# Patient Record
Sex: Female | Born: 1975 | Race: Black or African American | Hispanic: No | Marital: Single | Smoking: Never smoker
Health system: Southern US, Community
[De-identification: ages and names within clinical notes are randomized; demographics above are authoritative.]

## PROBLEM LIST (undated history)

## (undated) DIAGNOSIS — L309 Dermatitis, unspecified: Secondary | ICD-10-CM

## (undated) DIAGNOSIS — Z87442 Personal history of urinary calculi: Secondary | ICD-10-CM

## (undated) DIAGNOSIS — Z8619 Personal history of other infectious and parasitic diseases: Secondary | ICD-10-CM

## (undated) DIAGNOSIS — I10 Essential (primary) hypertension: Secondary | ICD-10-CM

## (undated) DIAGNOSIS — N920 Excessive and frequent menstruation with regular cycle: Secondary | ICD-10-CM

## (undated) DIAGNOSIS — D259 Leiomyoma of uterus, unspecified: Secondary | ICD-10-CM

## (undated) DIAGNOSIS — D219 Benign neoplasm of connective and other soft tissue, unspecified: Secondary | ICD-10-CM

## (undated) DIAGNOSIS — A64 Unspecified sexually transmitted disease: Secondary | ICD-10-CM

## (undated) DIAGNOSIS — B009 Herpesviral infection, unspecified: Secondary | ICD-10-CM

## (undated) DIAGNOSIS — A630 Anogenital (venereal) warts: Secondary | ICD-10-CM

## (undated) DIAGNOSIS — N870 Mild cervical dysplasia: Secondary | ICD-10-CM

## (undated) DIAGNOSIS — D500 Iron deficiency anemia secondary to blood loss (chronic): Secondary | ICD-10-CM

## (undated) HISTORY — DX: Mild cervical dysplasia: N87.0

## (undated) HISTORY — DX: Essential (primary) hypertension: I10

## (undated) HISTORY — PX: UMBILICAL HERNIA REPAIR: SHX196

## (undated) HISTORY — PX: FOOT SURGERY: SHX648

## (undated) HISTORY — PX: GYNECOLOGIC CRYOSURGERY: SHX857

## (undated) HISTORY — DX: Herpesviral infection, unspecified: B00.9

## (undated) HISTORY — PX: HYSTEROSCOPY WITH RESECTOSCOPE: SHX5395

## (undated) HISTORY — DX: Anogenital (venereal) warts: A63.0

## (undated) HISTORY — PX: HERNIA REPAIR: SHX51

## (undated) HISTORY — DX: Benign neoplasm of connective and other soft tissue, unspecified: D21.9

## (undated) HISTORY — DX: Unspecified sexually transmitted disease: A64

## (undated) HISTORY — PX: MOUTH SURGERY: SHX715

## (undated) HISTORY — PX: CYSTOSCOPY WITH RETROGRADE PYELOGRAM, URETEROSCOPY AND STENT PLACEMENT: SHX5789

## (undated) HISTORY — PX: MANDIBLE SURGERY: SHX707

---

## 1998-05-30 ENCOUNTER — Emergency Department (HOSPITAL_COMMUNITY): Admission: EM | Admit: 1998-05-30 | Discharge: 1998-05-30 | Payer: Self-pay | Admitting: Emergency Medicine

## 1998-05-30 ENCOUNTER — Encounter: Payer: Self-pay | Admitting: Emergency Medicine

## 1999-02-22 ENCOUNTER — Emergency Department (HOSPITAL_COMMUNITY): Admission: EM | Admit: 1999-02-22 | Discharge: 1999-02-22 | Payer: Self-pay | Admitting: Emergency Medicine

## 1999-02-22 ENCOUNTER — Encounter: Payer: Self-pay | Admitting: Emergency Medicine

## 1999-07-04 ENCOUNTER — Encounter: Payer: Self-pay | Admitting: Emergency Medicine

## 1999-07-04 ENCOUNTER — Emergency Department (HOSPITAL_COMMUNITY): Admission: EM | Admit: 1999-07-04 | Discharge: 1999-07-04 | Payer: Self-pay | Admitting: Emergency Medicine

## 1999-08-24 ENCOUNTER — Emergency Department (HOSPITAL_COMMUNITY): Admission: EM | Admit: 1999-08-24 | Discharge: 1999-08-24 | Payer: Self-pay | Admitting: Emergency Medicine

## 1999-08-24 ENCOUNTER — Encounter: Payer: Self-pay | Admitting: Emergency Medicine

## 1999-09-02 ENCOUNTER — Other Ambulatory Visit: Admission: RE | Admit: 1999-09-02 | Discharge: 1999-09-02 | Payer: Self-pay | Admitting: *Deleted

## 1999-10-01 ENCOUNTER — Ambulatory Visit (HOSPITAL_COMMUNITY): Admission: RE | Admit: 1999-10-01 | Discharge: 1999-10-01 | Payer: Self-pay | Admitting: Surgery

## 1999-10-21 ENCOUNTER — Encounter: Payer: Self-pay | Admitting: Surgery

## 1999-10-21 ENCOUNTER — Encounter: Admission: RE | Admit: 1999-10-21 | Discharge: 1999-10-21 | Payer: Self-pay | Admitting: Surgery

## 2000-03-17 ENCOUNTER — Emergency Department (HOSPITAL_COMMUNITY): Admission: EM | Admit: 2000-03-17 | Discharge: 2000-03-17 | Payer: Self-pay | Admitting: Emergency Medicine

## 2000-09-05 ENCOUNTER — Other Ambulatory Visit: Admission: RE | Admit: 2000-09-05 | Discharge: 2000-09-05 | Payer: Self-pay | Admitting: *Deleted

## 2000-11-05 ENCOUNTER — Inpatient Hospital Stay (HOSPITAL_COMMUNITY): Admission: AD | Admit: 2000-11-05 | Discharge: 2000-11-05 | Payer: Self-pay | Admitting: Gynecology

## 2000-11-05 ENCOUNTER — Encounter: Payer: Self-pay | Admitting: Gynecology

## 2001-05-01 ENCOUNTER — Emergency Department (HOSPITAL_COMMUNITY): Admission: EM | Admit: 2001-05-01 | Discharge: 2001-05-01 | Payer: Self-pay | Admitting: *Deleted

## 2001-05-01 ENCOUNTER — Encounter: Payer: Self-pay | Admitting: Emergency Medicine

## 2001-09-04 ENCOUNTER — Other Ambulatory Visit: Admission: RE | Admit: 2001-09-04 | Discharge: 2001-09-04 | Payer: Self-pay | Admitting: *Deleted

## 2002-01-18 HISTORY — PX: OTHER SURGICAL HISTORY: SHX169

## 2002-01-30 ENCOUNTER — Encounter (INDEPENDENT_AMBULATORY_CARE_PROVIDER_SITE_OTHER): Payer: Self-pay | Admitting: *Deleted

## 2002-01-30 ENCOUNTER — Ambulatory Visit (HOSPITAL_COMMUNITY): Admission: RE | Admit: 2002-01-30 | Discharge: 2002-01-31 | Payer: Self-pay | Admitting: Oral Surgery

## 2002-07-12 ENCOUNTER — Encounter: Payer: Self-pay | Admitting: Emergency Medicine

## 2002-07-12 ENCOUNTER — Emergency Department (HOSPITAL_COMMUNITY): Admission: EM | Admit: 2002-07-12 | Discharge: 2002-07-13 | Payer: Self-pay | Admitting: Emergency Medicine

## 2002-09-06 ENCOUNTER — Other Ambulatory Visit: Admission: RE | Admit: 2002-09-06 | Discharge: 2002-09-06 | Payer: Self-pay | Admitting: Obstetrics and Gynecology

## 2003-01-19 DIAGNOSIS — Z87410 Personal history of cervical dysplasia: Secondary | ICD-10-CM

## 2003-01-19 HISTORY — PX: GYNECOLOGIC CRYOSURGERY: SHX857

## 2003-01-19 HISTORY — DX: Personal history of cervical dysplasia: Z87.410

## 2003-01-24 ENCOUNTER — Other Ambulatory Visit: Admission: RE | Admit: 2003-01-24 | Discharge: 2003-01-24 | Payer: Self-pay | Admitting: Obstetrics and Gynecology

## 2003-02-19 DIAGNOSIS — N870 Mild cervical dysplasia: Secondary | ICD-10-CM | POA: Insufficient documentation

## 2003-09-11 ENCOUNTER — Other Ambulatory Visit: Admission: RE | Admit: 2003-09-11 | Discharge: 2003-09-11 | Payer: Self-pay | Admitting: Obstetrics and Gynecology

## 2004-01-19 DIAGNOSIS — Z8619 Personal history of other infectious and parasitic diseases: Secondary | ICD-10-CM

## 2004-01-19 HISTORY — DX: Personal history of other infectious and parasitic diseases: Z86.19

## 2004-03-09 ENCOUNTER — Other Ambulatory Visit: Admission: RE | Admit: 2004-03-09 | Discharge: 2004-03-09 | Payer: Self-pay | Admitting: Obstetrics and Gynecology

## 2004-06-26 ENCOUNTER — Emergency Department (HOSPITAL_COMMUNITY): Admission: EM | Admit: 2004-06-26 | Discharge: 2004-06-26 | Payer: Self-pay | Admitting: Emergency Medicine

## 2004-08-18 DIAGNOSIS — A64 Unspecified sexually transmitted disease: Secondary | ICD-10-CM | POA: Insufficient documentation

## 2004-09-11 ENCOUNTER — Other Ambulatory Visit: Admission: RE | Admit: 2004-09-11 | Discharge: 2004-09-11 | Payer: Self-pay | Admitting: Obstetrics and Gynecology

## 2005-03-01 ENCOUNTER — Other Ambulatory Visit: Admission: RE | Admit: 2005-03-01 | Discharge: 2005-03-01 | Payer: Self-pay | Admitting: Obstetrics and Gynecology

## 2005-03-17 ENCOUNTER — Emergency Department (HOSPITAL_COMMUNITY): Admission: EM | Admit: 2005-03-17 | Discharge: 2005-03-17 | Payer: Self-pay | Admitting: Emergency Medicine

## 2005-03-18 HISTORY — PX: EXTRACORPOREAL SHOCK WAVE LITHOTRIPSY: SHX1557

## 2005-03-19 ENCOUNTER — Emergency Department (HOSPITAL_COMMUNITY): Admission: EM | Admit: 2005-03-19 | Discharge: 2005-03-19 | Payer: Self-pay | Admitting: Emergency Medicine

## 2005-03-21 ENCOUNTER — Inpatient Hospital Stay (HOSPITAL_COMMUNITY): Admission: AD | Admit: 2005-03-21 | Discharge: 2005-03-21 | Payer: Self-pay | Admitting: Family Medicine

## 2005-03-22 ENCOUNTER — Ambulatory Visit (HOSPITAL_COMMUNITY): Admission: AD | Admit: 2005-03-22 | Discharge: 2005-03-22 | Payer: Self-pay | Admitting: Urology

## 2005-03-25 ENCOUNTER — Ambulatory Visit (HOSPITAL_COMMUNITY): Admission: RE | Admit: 2005-03-25 | Discharge: 2005-03-25 | Payer: Self-pay | Admitting: Urology

## 2005-05-19 ENCOUNTER — Encounter: Payer: Self-pay | Admitting: Gynecology

## 2005-09-13 ENCOUNTER — Other Ambulatory Visit: Admission: RE | Admit: 2005-09-13 | Discharge: 2005-09-13 | Payer: Self-pay | Admitting: Obstetrics and Gynecology

## 2006-02-28 ENCOUNTER — Other Ambulatory Visit: Admission: RE | Admit: 2006-02-28 | Discharge: 2006-02-28 | Payer: Self-pay | Admitting: Obstetrics and Gynecology

## 2006-09-20 ENCOUNTER — Other Ambulatory Visit: Admission: RE | Admit: 2006-09-20 | Discharge: 2006-09-20 | Payer: Self-pay | Admitting: Obstetrics and Gynecology

## 2007-02-13 ENCOUNTER — Emergency Department (HOSPITAL_COMMUNITY): Admission: EM | Admit: 2007-02-13 | Discharge: 2007-02-13 | Payer: Self-pay | Admitting: Emergency Medicine

## 2007-02-20 ENCOUNTER — Other Ambulatory Visit: Admission: RE | Admit: 2007-02-20 | Discharge: 2007-02-20 | Payer: Self-pay | Admitting: Obstetrics and Gynecology

## 2007-09-26 ENCOUNTER — Ambulatory Visit: Payer: Self-pay | Admitting: Obstetrics and Gynecology

## 2007-09-26 ENCOUNTER — Encounter: Payer: Self-pay | Admitting: Obstetrics and Gynecology

## 2007-09-26 ENCOUNTER — Other Ambulatory Visit: Admission: RE | Admit: 2007-09-26 | Discharge: 2007-09-26 | Payer: Self-pay | Admitting: Obstetrics and Gynecology

## 2008-01-19 HISTORY — PX: HYSTEROSCOPY: SHX211

## 2008-03-04 ENCOUNTER — Ambulatory Visit: Payer: Self-pay | Admitting: Obstetrics and Gynecology

## 2008-06-03 ENCOUNTER — Ambulatory Visit: Payer: Self-pay | Admitting: Obstetrics and Gynecology

## 2008-08-07 ENCOUNTER — Encounter: Admission: RE | Admit: 2008-08-07 | Discharge: 2008-08-07 | Payer: Self-pay | Admitting: Obstetrics and Gynecology

## 2008-09-26 ENCOUNTER — Other Ambulatory Visit: Admission: RE | Admit: 2008-09-26 | Discharge: 2008-09-26 | Payer: Self-pay | Admitting: Obstetrics and Gynecology

## 2008-09-26 ENCOUNTER — Ambulatory Visit: Payer: Self-pay | Admitting: Obstetrics and Gynecology

## 2008-09-26 ENCOUNTER — Encounter: Payer: Self-pay | Admitting: Obstetrics and Gynecology

## 2008-10-03 ENCOUNTER — Ambulatory Visit: Payer: Self-pay | Admitting: Obstetrics and Gynecology

## 2008-10-29 ENCOUNTER — Ambulatory Visit: Payer: Self-pay | Admitting: Obstetrics and Gynecology

## 2008-11-04 ENCOUNTER — Ambulatory Visit (HOSPITAL_BASED_OUTPATIENT_CLINIC_OR_DEPARTMENT_OTHER): Admission: RE | Admit: 2008-11-04 | Discharge: 2008-11-04 | Payer: Self-pay | Admitting: Obstetrics and Gynecology

## 2008-11-04 ENCOUNTER — Encounter: Payer: Self-pay | Admitting: Obstetrics and Gynecology

## 2008-11-04 ENCOUNTER — Ambulatory Visit: Payer: Self-pay | Admitting: Obstetrics and Gynecology

## 2008-11-12 ENCOUNTER — Ambulatory Visit: Payer: Self-pay | Admitting: Obstetrics and Gynecology

## 2008-11-28 ENCOUNTER — Ambulatory Visit: Payer: Self-pay | Admitting: Obstetrics and Gynecology

## 2009-04-09 ENCOUNTER — Ambulatory Visit: Payer: Self-pay | Admitting: Obstetrics and Gynecology

## 2009-10-02 ENCOUNTER — Ambulatory Visit: Payer: Self-pay | Admitting: Obstetrics and Gynecology

## 2009-10-02 ENCOUNTER — Other Ambulatory Visit: Admission: RE | Admit: 2009-10-02 | Discharge: 2009-10-02 | Payer: Self-pay | Admitting: Obstetrics and Gynecology

## 2010-04-29 ENCOUNTER — Emergency Department (HOSPITAL_COMMUNITY)
Admission: EM | Admit: 2010-04-29 | Discharge: 2010-04-29 | Disposition: A | Discharge status: Home / Self Care | Payer: BC Managed Care – HMO | Attending: Emergency Medicine | Admitting: Emergency Medicine

## 2010-04-29 DIAGNOSIS — R61 Generalized hyperhidrosis: Secondary | ICD-10-CM | POA: Insufficient documentation

## 2010-04-29 DIAGNOSIS — R05 Cough: Secondary | ICD-10-CM | POA: Insufficient documentation

## 2010-04-29 DIAGNOSIS — R599 Enlarged lymph nodes, unspecified: Secondary | ICD-10-CM | POA: Insufficient documentation

## 2010-04-29 DIAGNOSIS — J029 Acute pharyngitis, unspecified: Secondary | ICD-10-CM | POA: Insufficient documentation

## 2010-04-29 DIAGNOSIS — R059 Cough, unspecified: Secondary | ICD-10-CM | POA: Insufficient documentation

## 2010-04-29 LAB — RAPID STREP SCREEN (MED CTR MEBANE ONLY): Streptococcus, Group A Screen (Direct): NEGATIVE

## 2010-06-05 NOTE — Op Note (Signed)
NAMEADALIDA, GARVER                ACCOUNT NO.:  [id number]   MEDICAL RECORD NO.:  [id number]         PATIENT TYPE:  LAMB   LOCATION:                               FACILITY:  [facility]   PHYSICIAN:  Excell Seltzer. Annabell Howells, M.D.    DATE OF BIRTH:  Feb 25, 1975   DATE OF PROCEDURE:  03/22/2005  DATE OF DISCHARGE:  03/21/2005                                 OPERATIVE REPORT   PROCEDURE:  Cystoscopy. right retrograde pyelogram with interpretation and  placement of right double-J stent.   PREOPERATIVE DIAGNOSIS:  Right proximal ureteral stone.   POSTOPERATIVE DIAGNOSIS:  Right proximal ureteral stone.   SURGEON:  Bjorn Pippin, M.D.   ANESTHESIA:  General.   DRAINS:  6-French x 24 cm double-J stent.   COMPLICATIONS:  None.   INDICATIONS:  Ms. Mcdowell is a 35 year old black female who was found to have a  4 mm proximal right ureteral stone on a CT scan in the ER.  She has had  persistent pain since that diagnosis and after discussing treatment options,  she has elected to undergo stent placement today followed by lithotripsy on  Thursday   DESCRIPTION OF PROCEDURE AND FINDINGS:  The patient was given Cipro  preoperatively.  She was taken to the operating room where general  anesthetic was induced.  She was placed in the lithotomy position.  Her  Perineum and genitalia were prepped with Betadine solution, and she was  draped in the usual sterile fashion.   Cystoscopy was performed using a 21-French scope and the 12 and 70 degrees  lenses. Examination revealed a normal urethra.  The bladder wall was smooth  and pale without tumor, stones or inflammation.  Ureteral orifices were  unremarkable.   A right retrograde pyelogram was performed using a 5-French opening catheter  and Hypaque.  Installation of the contrast demonstrated a normal ureter up  to just below the UPJ where a filling defect consistent with the 4 mm stone  was noted proximal to the filling defect.  There was some dilation of the  internal collecting system.  The stone was not readily visualized on the  initial view.   After completion of the retrograde pyelogram, the guidewire was advanced to  the kidney and a 6-French 24 cm double-J stent without string was inserted  without difficulty over the wire under fluoroscopic guidance.  The wire was  removed leaving good coil in the kidney and good coil in the bladder.   The bladder was then drained.  The cystoscope was removed.  A B&O  suppository was placed.  The patient was taken down from lithotomy position.  Her anesthetic was reversed.  She was removed to the recovery room in stable  condition.  There were no complications.      Excell Seltzer. Annabell Howells, M.D.  Electronically Signed     JJW/MEDQ  D:  03/22/2005  T:  03/23/2005  Job:  [id number]

## 2010-06-05 NOTE — Op Note (Signed)
[facility]  Patient:    Alyssa [name], Alyssa [name]                         MRN: [id number] Proc. Date: 10/01/99 Adm. Date:  [id number] Attending:  Abigail Miyamoto A                           Operative Report  PREOPERATIVE DIAGNOSIS:  Umbilical hernia.  POSTOPERATIVE DIAGNOSIS:  Umbilical hernia.  PROCEDURE:  Umbilical hernia repair.  SURGEON:  Dr. Riley Lam A. Magnus Ivan.  ANESTHESIA:  General endotracheal anesthesia and 0.25% Marcaine plain.  ESTIMATED BLOOD LOSS:  Minimal.  DESCRIPTION OF PROCEDURE:  The patient was brought to the operating room and identified as [employer]. She was placed supine on the operating table and anesthesia was induced. Her abdomen was then prepped and draped in the usual sterile fashion. Using a #15 blade, a small transverse incision was made just below the umbilicus. The incision was carried down to the fascia. The hernia sac was then controlled circumferentially with a hemostat. It was then separated from the overlying umbilical skin. The patient was found to have a moderate amount of scar tissue from a previous umbilical hernia repair as a child. The sac and scar were removed revealing a small fascial defect. The fascial defect was then closed with interrupted #0 Ethibond sutures. Next, the wound was anesthetized with 0.25% Marcaine. The umbilicus was tacked in place with 2-0 Vicryl suture. The subcutaneous layer was closed with interrupted 3-0 Vicryl sutures and the skin was closed with a running 4-0 monocryl. Steri-Strips, gauze and Tegaderm were then applied. The patient tolerated the procedure well. All sponge, needle and instrument counts were correct at the end of the procedure. The patient was the extubated in the operating room and taken in stable condition to the recovery room. DD:  10/01/99 TD:  10/02/99 Job: [id number]

## 2010-06-05 NOTE — Op Note (Signed)
Alyssa [name], [name]                          ACCOUNT NO.:  [id number]   MEDICAL RECORD NO.:  [id number]                   PATIENT TYPE:  OIB   LOCATION:  2871                                 FACILITY:  [facility]   PHYSICIAN:  Hewitt Blade, D.D.S.             DATE OF BIRTH:  10-31-1975   DATE OF PROCEDURE:  01/30/2002  DATE OF DISCHARGE:  01/31/2002                                 OPERATIVE REPORT   PREOPERATIVE DIAGNOSES:  A 3.5 cm intrabony cyst - right mandible, 2.0 cm  intrabony cyst - left mandible, impacted third molar teeth, numbers 1, 16,  17, and 32.   POSTOPERATIVE DIAGNOSES:  A 3.5 cm intrabony cyst - right mandible, 2.0 cm  intrabony cyst - left mandible, impacted third molar teeth, numbers 1, 16,  17, and 32.   SURGERY PERFORMED:  Removal of the intrabony cysts of the right and left  mandible, removal of embedded third molar teeth, numbers 1, 16, 17, and 32.   INDICATIONS FOR PROCEDURE:  The patient is a 35 year old black female, who  was referred to my office for evaluation and removal of a large cyst in the  right mandible.  A panoramic radiograph was obtained, which revealed a 3.5  cm intrabony cyst associated with the embedded and displaced tooth number 32  and a 2.0 cm intrabony cyst associated with embedded third molar tooth  number 17.  The patient had developed severe pain and infection in the cyst.  Due to the displacement of the tooth inferiorly and posteriorly into the  mandible around the neurovascular bundle, the large size of the cyst  involving the neurovascular bundles, it was recommended that the procedure  be performed under general anesthesia, where the nerves could be dissected  free.   DETAILS OF PROCEDURE:  On January 30, 2002, the patient was taken to [facility], where she was placed on the operating room  table in a supine position.  Following successful nasal endotracheal  intubation and general anesthesia, the  patient's face, neck, and oral cavity  were prepped and draped in usual sterile operating room fashion.  The  hypopharynx was suctioned free of fluid and secretions, and a moistened 2  inch vaginal pack was placed as a throat pack.  Attention was then directed intraorally, where approximately 12 cc of 0.5%  Xylocaine containing 1:200,000 epinephrine were infiltrated in the right and  left posterior/superior alveolar neurovascular regions, corresponding  palatal soft tissues, and the right and left inferior alveolar neurovascular  regions.  Attention was then directed towards the right maxillary arch,  where a 2.0 curvilinear incision was made over the alveolus distal to the  tooth number two.  A #9 Molt periosteal elevator was used to reflect a full-  thickness mucoperiosteal flap superiorly and laterally exposing the cortical  bone of the right maxilla.  The overlying bone was then reduced using a  Stryker rotary osteotome and a #8 round bur.  The embedded tooth was  visualized in the alveolus and subluxated using a 301 and a 1191 dental  elevator.  The tooth was removed from the oral cavity using rongeur and  cutting forceps.  The surrounding follicular tissue was curetted and removed  using a double-ended Molt curet.  Bony margins were smoothed and contoured  with a small osseous file.  Then, the surgical site was thoroughly irrigated  with sterile saline irrigating solution and suctioned.  The mucoperiosteal  margins were approximated and sutured in an interrupted fashion using 4-0  chromic suture material.  In a similar fashion, third molar tooth and  follicular tissue #16 was removed as well.  Attention was then directed  towards the right mandible, where a 2.5 curvilinear incision was made  through the mucoperiosteal tissues over the ascending ramus and brought  through the soft tissues of the right posterior buccal vestibule.  A #9 Molt  periosteal elevator was used to reflect a  full-thickness mucoperiosteal flap  laterally and inferiorly exposing the superior and lateral aspect of the  bony mandible.  An intraosseous window was then created using the Stryker  rotary osteotome with a #8 round bur.  The large cystic cavity was then  entered and found to be full of yellowish colored fluid.  The embedded tooth  was then visualized at the inferior portion of the cystic cavity and the  tooth was sectioned in its long and vertical axes using the Stryker rotary  osteotome and a 107 fissure bur.  The sections of tooth were then removed  from the mandible using a 301 elevator, 11A elevator, and  190 elevator.  The fragments were then removed from the mandible using mosquito hemostats.  The surrounding dental follicular tissues were then curetted from the  margins of the cystic cavity using a double-ended Molt curet.  The  neurovascular bundle was encountered and the cystic lining was dissected  from the neurovascular with the neurovascular bundle remaining intact.  The  cystic lining was submitted for histologic examination.  The bony margins  were then thoroughly inspected for remnants of the cystic lining.  None were  found.  The bony margins of the intraosseous window were then smoothed with  a small osseous file and the surgical site was copiously irrigated with  sterile saline irrigating solutions and suctioned.  The mucoperiosteal  margins were approximated in a continuous interlocking fashion using 4-0  chromic suture material.  In a similar fashion, embedded tooth #17 and the  associated cyst were removed as well.  The oral cavity was then thoroughly  suctioned free of fluids and secretions.  The throat pack was removed and  the hypopharynx suctioned free of  fluids and secretions.  The patient was  then allowed to awaken from the anesthesia and taken to the recovery room,  where she tolerated the procedure well without apparent complication.                                                Hewitt Blade, D.D.S.    DC/MEDQ  D:  02/02/2002  T:  02/03/2002  Job:  [id number]

## 2010-09-30 DIAGNOSIS — B009 Herpesviral infection, unspecified: Secondary | ICD-10-CM | POA: Insufficient documentation

## 2010-10-08 LAB — URINALYSIS, ROUTINE W REFLEX MICROSCOPIC
Glucose, UA: NEGATIVE
Hgb urine dipstick: NEGATIVE
Nitrite: NEGATIVE

## 2010-10-08 LAB — CBC
HCT: 38.2
MCHC: 33.4
RDW: 12.5
WBC: 5.9

## 2010-10-08 LAB — BASIC METABOLIC PANEL
BUN: 7
CO2: 25
Chloride: 105
GFR calc Af Amer: >60
Glucose, Bld: 87

## 2010-10-08 LAB — DIFFERENTIAL
Basophils Absolute: 0
Eosinophils Relative: 3
Lymphocytes Relative: 40
Lymphs Abs: 2.4
Monocytes Absolute: 0.5
Neutro Abs: 2.8

## 2010-10-08 LAB — PREGNANCY, URINE: Preg Test, Ur: NEGATIVE

## 2010-10-14 ENCOUNTER — Other Ambulatory Visit (HOSPITAL_COMMUNITY)
Admission: RE | Admit: 2010-10-14 | Discharge: 2010-10-14 | Disposition: A | Discharge status: Home / Self Care | Payer: BC Managed Care – HMO | Source: Ambulatory Visit | Attending: Obstetrics and Gynecology | Admitting: Obstetrics and Gynecology

## 2010-10-14 ENCOUNTER — Encounter: Payer: Self-pay | Admitting: Obstetrics and Gynecology

## 2010-10-14 ENCOUNTER — Ambulatory Visit (INDEPENDENT_AMBULATORY_CARE_PROVIDER_SITE_OTHER): Payer: BC Managed Care – HMO | Admitting: Obstetrics and Gynecology

## 2010-10-14 DIAGNOSIS — Z113 Encounter for screening for infections with a predominantly sexual mode of transmission: Secondary | ICD-10-CM

## 2010-10-14 DIAGNOSIS — A549 Gonococcal infection, unspecified: Secondary | ICD-10-CM

## 2010-10-14 DIAGNOSIS — Z309 Encounter for contraceptive management, unspecified: Secondary | ICD-10-CM

## 2010-10-14 DIAGNOSIS — Z01419 Encounter for gynecological examination (general) (routine) without abnormal findings: Secondary | ICD-10-CM | POA: Insufficient documentation

## 2010-10-14 DIAGNOSIS — Z833 Family history of diabetes mellitus: Secondary | ICD-10-CM

## 2010-10-14 DIAGNOSIS — D219 Benign neoplasm of connective and other soft tissue, unspecified: Secondary | ICD-10-CM | POA: Insufficient documentation

## 2010-10-14 DIAGNOSIS — A5400 Gonococcal infection of lower genitourinary tract, unspecified: Secondary | ICD-10-CM

## 2010-10-14 MED ORDER — NORELGESTROMIN-ETH ESTRADIOL 150-35 MCG/24HR TD PTWK: 1.0000 | MEDICATED_PATCH | TRANSDERMAL | Status: DC

## 2010-10-14 NOTE — Progress Notes (Signed)
Patient came to see me today for her annual GYN exam. She is doing well and is still happy with her contraceptive patch. She continues with acyclovir 400 mg daily. She's had no reoccurrences of HSV. She still very uncomfortable with the fact that she has it. She is having regular periods.  Physical examination: HEENT within normal limits. Neck: Thyroid not large. No masses. Supraclavicular nodes: not enlarged. Breasts: Examined in both sitting midline position. No skin changes and no masses. Abdomen: Soft no guarding rebound or masses or hernia. Pelvic: External: Within normal limits. BUS: Within normal limits. Vaginal:within normal limits. Good estrogen effect. No evidence of cystocele rectocele or enterocele. Cervix: clean. Uterus: Normal size and shape. Adnexa: No masses. Rectovaginal exam: Confirmatory and negative. Extremities: Within normal limits.  Assessment: HSV-2  Plan: Continue Ortho Evra patch. Continue acyclovir 400 mg daily. We discussed today counseling because of her concerns with the HSV.

## 2010-10-14 NOTE — Progress Notes (Addendum)
Addended byCammie Mcgee T on: 10/14/2010 03:52 PM  Modules accepted: Orders Patient's gonorrhea culture came back positive. Her chlamydia culture was negative. She was treated with Suprax and Zithromax. She was instructed not to have intercourse till she had negative cultures and her partner was treated and was cured. She will contact him. She knows to return to my office in 3 weeks.

## 2010-10-15 MED ORDER — AZITHROMYCIN 500 MG PO TABS
1000.0000 mg | ORAL_TABLET | Freq: Once | ORAL | Status: AC
Start: 2010-10-15 — End: 2010-10-18

## 2010-10-15 MED ORDER — CEFIXIME 400 MG PO TABS: 400.0000 mg | ORAL_TABLET | Freq: Every day | ORAL | Status: AC

## 2010-10-15 NOTE — Progress Notes (Signed)
Addended by: Trellis Paganini on: 10/15/2010 03:10 PM   Modules accepted: Orders

## 2010-10-19 ENCOUNTER — Telehealth: Payer: Self-pay | Admitting: *Deleted

## 2010-10-19 MED ORDER — FLUCONAZOLE 150 MG PO TABS: 150.0000 mg | ORAL_TABLET | Freq: Once | ORAL | Status: DC

## 2010-10-19 NOTE — Telephone Encounter (Signed)
Patient called c/o yeast infection since she has been prescribed an antibiotic.  Wants to get an RX for Difulcan.  Please advise.

## 2010-10-19 NOTE — Telephone Encounter (Signed)
Please call in Diflucan 150x1 dose with a refill.

## 2010-10-19 NOTE — Telephone Encounter (Signed)
Lm on patients vm.  RX sent.

## 2010-10-22 ENCOUNTER — Telehealth: Payer: Self-pay | Admitting: *Deleted

## 2010-10-22 NOTE — Telephone Encounter (Signed)
Patient upset about GC results.  York Spaniel that partner has been tested and has neg results.  Patient not understanding how come pos results here for her.  I told her I knew there was an incubation time.  She said she wants to speak directly with you.  She said she was told we had a new "system".  I told her that we did not have a new lab machine, we had a new computer system.  She is concerned that her result would be mixed with someone else.  And also wants to see if it could be run again.  Please call patient.  Thanks.

## 2010-10-22 NOTE — Telephone Encounter (Signed)
TC reviewed transmission of GC, also men can have false neg. Will RTO for TOC.  Questions answered.

## 2010-11-06 ENCOUNTER — Ambulatory Visit (INDEPENDENT_AMBULATORY_CARE_PROVIDER_SITE_OTHER): Payer: BC Managed Care – HMO | Admitting: Women's Health

## 2010-11-06 ENCOUNTER — Encounter: Payer: Self-pay | Admitting: Women's Health

## 2010-11-06 DIAGNOSIS — Z113 Encounter for screening for infections with a predominantly sexual mode of transmission: Secondary | ICD-10-CM

## 2010-11-06 DIAGNOSIS — Z708 Other sex counseling: Secondary | ICD-10-CM

## 2010-11-06 NOTE — Progress Notes (Signed)
  Presents for a test to cure GC. Has been treated, has abstained. Denies any abdominal pain or discharge. External genitalia is within normal limits, speculum exam cervix is pink healthy without discharge or erythema. GC/Chlamydia culture taken and is pending. No CMT or adnexal fullness or tenderness. Declines an HIV, hepatitis and RPR. Tearful, upset over test results. Plans to abstain, encouraged condoms if becomes active.

## 2011-04-27 ENCOUNTER — Other Ambulatory Visit: Payer: Self-pay | Admitting: Obstetrics and Gynecology

## 2011-07-14 ENCOUNTER — Ambulatory Visit (INDEPENDENT_AMBULATORY_CARE_PROVIDER_SITE_OTHER): Payer: BC Managed Care – PPO | Admitting: Emergency Medicine

## 2011-07-14 VITALS — BP 135/88 | HR 73 | Temp 98.6°F | Resp 16 | Ht 63.0 in | Wt 173.4 lb

## 2011-07-14 DIAGNOSIS — W57XXXA Bitten or stung by nonvenomous insect and other nonvenomous arthropods, initial encounter: Secondary | ICD-10-CM

## 2011-07-14 DIAGNOSIS — Z111 Encounter for screening for respiratory tuberculosis: Secondary | ICD-10-CM

## 2011-07-14 NOTE — Progress Notes (Deleted)
  Subjective:    Patient ID: Alyssa [name], female    DOB: August 24, 1975, 36 y.o.   MRN: [id number]  HPI    Review of Systems     Objective:   Physical Exam        Assessment & Plan:

## 2011-07-14 NOTE — Progress Notes (Signed)
     Patient Name: Alyssa [name] Date of Birth: [birth year] Medical Record Number: [id number] Gender: female Date of Encounter: 07/14/2011  History of Present Illness:  Alyssa [name] is a 36 y.o. very pleasant female patient who presents with the following:  Bitten by insect on the base of her neck last night.  Now red and swollen.  pruritic  Patient Active Problem List  Diagnosis  . STD (sexually transmitted disease)  . CIN I (cervical intraepithelial neoplasia I)  . HSV infection  . Fibroid   Past Medical History  Diagnosis Date  . 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 HSV infection   . Fibroid     MYOMA  . STD (sexually transmitted disease) 08/2004    POSITIVE GC-HSVll   Past Surgical History  Procedure Date  . Hernia repair     X2  . Kidney stones 2004  . Foot surgery   . Hysteroscopy 2010    HYSTEROSCOPIC MYOMECTOMY  . Colposcopy   . Gynecologic cryosurgery   . Mouth surgery    History  Substance Use Topics  . Smoking status: Never Smoker   . Smokeless tobacco: Not on file  . Alcohol Use: Yes     rare   Family History  Problem Relation Age of Onset  . Diabetes Mother   . Hypertension Mother   . Breast cancer Maternal Aunt   . Diabetes Maternal Grandmother   . Hypertension Maternal Grandmother    No Known Allergies  Medication list has been reviewed and updated.  Prior to Admission medications   Medication Sig Start Date End Date Taking? Authorizing Provider  acyclovir (ZOVIRAX) 400 MG tablet TAKE 1 TABLET 3 TIMES A DAY FOR 7 DAYS, THEN TAKE 1 TABLET EVERY DAY 04/27/11  Yes Trellis Paganini, MD  norelgestromin-ethinyl estradiol (ORTHO EVRA) 150-20 MCG/24HR transdermal patch Place 1 patch onto the skin once a week. 10/14/10  Yes Trellis Paganini, MD    Review of Systems:  As per HPI, otherwise negative.    Physical Examination: Filed Vitals:   07/14/11 1436  BP: 135/88  Pulse: 73  Temp: 98.6 F (37 C)    Resp: 16   Filed Vitals:   07/14/11 1436  Height: 5\' 3"  (1.6 m)  Weight: 173 lb 6.4 oz (78.654 kg)   Body mass index is 30.72 kg/(m^2). Ideal Body Weight: Weight in (lb) to have BMI = 25: 140.8    GEN: WDWN, NAD, Non-toxic, Alert & Oriented x 3 HEENT: Atraumatic, Normocephalic.  Ears and Nose: No external deformity. EXTR: No clubbing/cyanosis/edema NEURO: Normal gait.  PSYCH: Normally interactive. Conversant. Not depressed or anxious appearing.  Calm demeanor.  Skin:  Erythematous raised lesion posterior neck  Assessment and Plan: Insect bite Treat with topical otc hydrocortisone cream  Carmelina Dane, MD

## 2011-07-17 ENCOUNTER — Encounter (INDEPENDENT_AMBULATORY_CARE_PROVIDER_SITE_OTHER): Payer: BC Managed Care – HMO

## 2011-07-17 DIAGNOSIS — Z111 Encounter for screening for respiratory tuberculosis: Secondary | ICD-10-CM

## 2011-08-20 LAB — TB SKIN TEST

## 2011-10-18 ENCOUNTER — Ambulatory Visit (INDEPENDENT_AMBULATORY_CARE_PROVIDER_SITE_OTHER): Payer: Self-pay | Admitting: Obstetrics and Gynecology

## 2011-10-18 ENCOUNTER — Encounter: Payer: Self-pay | Admitting: Obstetrics and Gynecology

## 2011-10-18 VITALS — BP 120/76 | Ht 63.0 in | Wt 179.0 lb

## 2011-10-18 DIAGNOSIS — Z113 Encounter for screening for infections with a predominantly sexual mode of transmission: Secondary | ICD-10-CM

## 2011-10-18 DIAGNOSIS — Z01419 Encounter for gynecological examination (general) (routine) without abnormal findings: Secondary | ICD-10-CM

## 2011-10-18 DIAGNOSIS — N898 Other specified noninflammatory disorders of vagina: Secondary | ICD-10-CM

## 2011-10-18 DIAGNOSIS — Z833 Family history of diabetes mellitus: Secondary | ICD-10-CM

## 2011-10-18 LAB — WET PREP FOR TRICH, YEAST, CLUE: Trich, Wet Prep: NONE SEEN

## 2011-10-18 MED ORDER — ACYCLOVIR 400 MG PO TABS: 400.0000 mg | ORAL_TABLET | Freq: Two times a day (BID) | ORAL | Status: DC

## 2011-10-18 MED ORDER — METRONIDAZOLE 0.75 % VA GEL: 1.0000 | Freq: Every day | VAGINAL | Status: DC

## 2011-10-18 MED ORDER — NORELGESTROMIN-ETH ESTRADIOL 150-35 MCG/24HR TD PTWK: 1.0000 | MEDICATED_PATCH | TRANSDERMAL | Status: DC

## 2011-10-18 NOTE — Progress Notes (Signed)
Patient came to see me today for her annual GYN exam. 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 She has had normal Pap smears since then. Her last Pap smear was 2012. We'll also treat her with acyclovir daily to prevent recurrent HSV 2. She actually does not take it daily but only if she feels a recurrence starting. She is having regular cycles. She is on the contraceptive patch. We treated her last year for GC. She had a followup negative culture. She has not been sexually active since then. She did however want to be rechecked. Several weeks ago she had a watery discharge. She took Monistat for one week and is now gone. She is having no abnormal bleeding. She is having no pelvic pain.  Physical examination:Alyssa [name] present. HEENT within normal limits. Neck: Thyroid not large. No masses. Supraclavicular nodes: not enlarged. Breasts: Examined in both sitting and lying  position. No skin changes and no masses. Abdomen: Soft no guarding rebound or masses or hernia. Pelvic: External: Within normal limits. BUS: Within normal limits. Vaginal:within normal limits. Good estrogen effect. No evidence of cystocele rectocele or enterocele. Cervix: clean. Uterus: Normal size and shape. Adnexa: No masses. Rectovaginal exam: Confirmatory and negative. Extremities: Within normal limits. Wet prep positive for amine, clue cells and bacteria.  Assessment: #1. Bacterial vaginosis #2. HSV-2 #3. CIN-1  Plan: We discussed higher circulating estrogen levels with a patch versus pills. We discussed the theoretical risk of DVT. We discussed the low-dose birth control pill. We discussed a Mirena IUD. Patient understands. She does  want to stay on the patch however. The new Pap smear guidelines were discussed with the patient. Pap not done. MetroGel vaginal cream one applicatorful at bedtime in  vagina for 5 days. Continue Zovirax when necessary.

## 2011-10-19 LAB — CBC WITH DIFFERENTIAL/PLATELET
Basophils Absolute: 0 10*3/uL (ref 0.0–0.1)
Eosinophils Relative: 3 % (ref 0–5)
Hemoglobin: 13 g/dL (ref 12.0–15.0)
MCV: 90.8 fL (ref 78.0–100.0)
Monocytes Absolute: 0.3 10*3/uL (ref 0.1–1.0)
Monocytes Relative: 5 % (ref 3–12)
Neutro Abs: 2.4 10*3/uL (ref 1.7–7.7)
Neutrophils Relative %: 38 % — ABNORMAL LOW (ref 43–77)
WBC: 6.4 10*3/uL (ref 4.0–10.5)

## 2011-10-19 LAB — URINALYSIS W MICROSCOPIC + REFLEX CULTURE
Bilirubin Urine: NEGATIVE
Casts: NONE SEEN
Glucose, UA: NEGATIVE mg/dL
Nitrite: NEGATIVE
Protein, ur: NEGATIVE mg/dL
Specific Gravity, Urine: 1.023 (ref 1.005–1.030)

## 2011-10-20 LAB — URINE CULTURE: Colony Count: 55000

## 2011-10-25 ENCOUNTER — Telehealth: Payer: Self-pay | Admitting: *Deleted

## 2011-10-25 ENCOUNTER — Other Ambulatory Visit: Payer: Self-pay | Admitting: Women's Health

## 2011-10-25 DIAGNOSIS — B373 Candidiasis of vulva and vagina: Secondary | ICD-10-CM

## 2011-10-25 MED ORDER — FLUCONAZOLE 150 MG PO TABS: 150.0000 mg | ORAL_TABLET | Freq: Once | ORAL | Status: DC

## 2011-10-25 NOTE — Telephone Encounter (Signed)
(  Dr.G patient) Pt given Metrogel on 10/18/11 completed medication on Friday, pt now c/o thick white discharge x 1 day now. No smell, nor other complaints. Please advise

## 2011-10-25 NOTE — Telephone Encounter (Signed)
Telephone call, states odor gone, questions if it is now yeast. Reviewed if not itching possibly the medicine. Requested a Diflucan, will call in and and only take if needed.

## 2011-11-11 ENCOUNTER — Telehealth: Payer: Self-pay | Admitting: *Deleted

## 2011-11-11 MED ORDER — NORELGESTROMIN-ETH ESTRADIOL 150-35 MCG/24HR TD PTWK: 1.0000 | MEDICATED_PATCH | TRANSDERMAL | Status: DC

## 2011-11-11 NOTE — Telephone Encounter (Signed)
Pt called requesting 1 patch called in to pharmacy for ortho evra, pt has mail order pharmacy and it wont be mailed until 5 days from now. rx sent to local pharmacy.

## 2012-04-28 ENCOUNTER — Ambulatory Visit: Payer: BC Managed Care – PPO

## 2012-04-28 ENCOUNTER — Ambulatory Visit (INDEPENDENT_AMBULATORY_CARE_PROVIDER_SITE_OTHER): Payer: BC Managed Care – PPO | Admitting: Family Medicine

## 2012-04-28 VITALS — BP 140/90 | HR 77 | Temp 98.0°F | Resp 16 | Ht 62.5 in | Wt 171.8 lb

## 2012-04-28 DIAGNOSIS — M25473 Effusion, unspecified ankle: Secondary | ICD-10-CM

## 2012-04-28 DIAGNOSIS — M79672 Pain in left foot: Secondary | ICD-10-CM

## 2012-04-28 DIAGNOSIS — M722 Plantar fascial fibromatosis: Secondary | ICD-10-CM

## 2012-04-28 DIAGNOSIS — M25472 Effusion, left ankle: Secondary | ICD-10-CM

## 2012-04-28 DIAGNOSIS — M25476 Effusion, unspecified foot: Secondary | ICD-10-CM

## 2012-04-28 DIAGNOSIS — M79609 Pain in unspecified limb: Secondary | ICD-10-CM

## 2012-04-28 LAB — URIC ACID: Uric Acid, Serum: 2.7 mg/dL (ref 2.4–7.0)

## 2012-04-28 MED ORDER — DICLOFENAC SODIUM 75 MG PO TBEC: 75.0000 mg | DELAYED_RELEASE_TABLET | Freq: Two times a day (BID) | ORAL | Status: DC

## 2012-04-28 NOTE — Patient Instructions (Signed)
Plantar Fasciitis (Heel Spur Syndrome) with Rehab The plantar fascia is a fibrous, ligament-like, soft-tissue structure that spans the bottom of the foot. Plantar fasciitis is a condition that causes pain in the foot due to inflammation of the tissue. SYMPTOMS   Pain and tenderness on the underneath side of the foot.  Pain that worsens with standing or walking. CAUSES  Plantar fasciitis is caused by irritation and injury to the plantar fascia on the underneath side of the foot. Common mechanisms of injury include:  Direct trauma to bottom of the foot.  Damage to a small nerve that runs under the foot where the main fascia attaches to the heel bone.  Stress placed on the plantar fascia due to bone spurs. RISK INCREASES WITH:   Activities that place stress on the plantar fascia (running, jumping, pivoting, or cutting).  Poor strength and flexibility.  Improperly fitted shoes.  Tight calf muscles.  Flat feet.  Failure to warm-up properly before activity.  Obesity. PREVENTION  Warm up and stretch properly before activity.  Allow for adequate recovery between workouts.  Maintain physical fitness:  Strength, flexibility, and endurance.  Cardiovascular fitness.  Maintain a health body weight.  Avoid stress on the plantar fascia.  Wear properly fitted shoes, including arch supports for individuals who have flat feet. PROGNOSIS  If treated properly, then the symptoms of plantar fasciitis usually resolve without surgery. However, occasionally surgery is necessary. RELATED COMPLICATIONS   Recurrent symptoms that may result in a chronic condition.  Problems of the lower back that are caused by compensating for the injury, such as limping.  Pain or weakness of the foot during push-off following surgery.  Chronic inflammation, scarring, and partial or complete fascia tear, occurring more often from repeated injections. TREATMENT  Treatment initially involves the use of  ice and medication to help reduce pain and inflammation. The use of strengthening and stretching exercises may help reduce pain with activity, especially stretches of the Achilles tendon. These exercises may be performed at home or with a therapist. Your caregiver may recommend that you use heel cups of arch supports to help reduce stress on the plantar fascia. Occasionally, corticosteroid injections are given to reduce inflammation. If symptoms persist for greater than 6 months despite non-surgical (conservative), then surgery may be recommended.  MEDICATION   If pain medication is necessary, then nonsteroidal anti-inflammatory medications, such as aspirin and ibuprofen, or other minor pain relievers, such as acetaminophen, are often recommended.  Do not take pain medication within 7 days before surgery.  Prescription pain relievers may be given if deemed necessary by your caregiver. Use only as directed and only as much as you need.  Corticosteroid injections may be given by your caregiver. These injections should be reserved for the most serious cases, because they may only be given a certain number of times. HEAT AND COLD  Cold treatment (icing) relieves pain and reduces inflammation. Cold treatment should be applied for 10 to 15 minutes every 2 to 3 hours for inflammation and pain and immediately after any activity that aggravates your symptoms. Use ice packs or massage the area with a piece of ice (ice massage).  Heat treatment may be used prior to performing the stretching and strengthening activities prescribed by your caregiver, physical therapist, or athletic trainer. Use a heat pack or soak the injury in warm water. SEEK IMMEDIATE MEDICAL CARE IF:  Treatment seems to offer no benefit, or the condition worsens.  Any medications produce adverse side effects. EXERCISES RANGE   OF MOTION (ROM) AND STRETCHING EXERCISES - Plantar Fasciitis (Heel Spur Syndrome) These exercises may help you  when beginning to rehabilitate your injury. Your symptoms may resolve with or without further involvement from your physician, physical therapist or athletic trainer. While completing these exercises, remember:   Restoring tissue flexibility helps normal motion to return to the joints. This allows healthier, less painful movement and activity.  An effective stretch should be held for at least 30 seconds.  A stretch should never be painful. You should only feel a gentle lengthening or release in the stretched tissue. RANGE OF MOTION - Toe Extension, Flexion  Sit with your right / left leg crossed over your opposite knee.  Grasp your toes and gently pull them back toward the top of your foot. You should feel a stretch on the bottom of your toes and/or foot.  Hold this stretch for __________ seconds.  Now, gently pull your toes toward the bottom of your foot. You should feel a stretch on the top of your toes and or foot.  Hold this stretch for __________ seconds. Repeat __________ times. Complete this stretch __________ times per day.  RANGE OF MOTION - Ankle Dorsiflexion, Active Assisted  Remove shoes and sit on a chair that is preferably not on a carpeted surface.  Place right / left foot under knee. Extend your opposite leg for support.  Keeping your heel down, slide your right / left foot back toward the chair until you feel a stretch at your ankle or calf. If you do not feel a stretch, slide your bottom forward to the edge of the chair, while still keeping your heel down.  Hold this stretch for __________ seconds. Repeat __________ times. Complete this stretch __________ times per day.  STRETCH  Gastroc, Standing  Place hands on wall.  Extend right / left leg, keeping the front knee somewhat bent.  Slightly point your toes inward on your back foot.  Keeping your right / left heel on the floor and your knee straight, shift your weight toward the wall, not allowing your back to  arch.  You should feel a gentle stretch in the right / left calf. Hold this position for __________ seconds. Repeat __________ times. Complete this stretch __________ times per day. STRETCH  Soleus, Standing  Place hands on wall.  Extend right / left leg, keeping the other knee somewhat bent.  Slightly point your toes inward on your back foot.  Keep your right / left heel on the floor, bend your back knee, and slightly shift your weight over the back leg so that you feel a gentle stretch deep in your back calf.  Hold this position for __________ seconds. Repeat __________ times. Complete this stretch __________ times per day. STRETCH  Gastrocsoleus, Standing  Note: This exercise can place a lot of stress on your foot and ankle. Please complete this exercise only if specifically instructed by your caregiver.   Place the ball of your right / left foot on a step, keeping your other foot firmly on the same step.  Hold on to the wall or a rail for balance.  Slowly lift your other foot, allowing your body weight to press your heel down over the edge of the step.  You should feel a stretch in your right / left calf.  Hold this position for __________ seconds.  Repeat this exercise with a slight bend in your right / left knee. Repeat __________ times. Complete this stretch __________ times per day.    STRENGTHENING EXERCISES - Plantar Fasciitis (Heel Spur Syndrome)  These exercises may help you when beginning to rehabilitate your injury. They may resolve your symptoms with or without further involvement from your physician, physical therapist or athletic trainer. While completing these exercises, remember:   Muscles can gain both the endurance and the strength needed for everyday activities through controlled exercises.  Complete these exercises as instructed by your physician, physical therapist or athletic trainer. Progress the resistance and repetitions only as guided. STRENGTH - Towel  Curls  Sit in a chair positioned on a non-carpeted surface.  Place your foot on a towel, keeping your heel on the floor.  Pull the towel toward your heel by only curling your toes. Keep your heel on the floor.  If instructed by your physician, physical therapist or athletic trainer, add ____________________ at the end of the towel. Repeat __________ times. Complete this exercise __________ times per day. STRENGTH - Ankle Inversion  Secure one end of a rubber exercise band/tubing to a fixed object (table, pole). Loop the other end around your foot just before your toes.  Place your fists between your knees. This will focus your strengthening at your ankle.  Slowly, pull your big toe up and in, making sure the band/tubing is positioned to resist the entire motion.  Hold this position for __________ seconds.  Have your muscles resist the band/tubing as it slowly pulls your foot back to the starting position. Repeat __________ times. Complete this exercises __________ times per day.  Document Released: 01/04/2005 Document Revised: 03/29/2011 Document Reviewed: 04/18/2008 [facility] Patient Information 2013 [place], [place]. Plantar Fasciitis Plantar fasciitis is a common condition that causes foot pain. It is soreness (inflammation) of the band of tough fibrous tissue on the bottom of the foot that runs from the heel bone (calcaneus) to the ball of the foot. The cause of this soreness may be from excessive standing, poor fitting shoes, running on hard surfaces, being overweight, having an abnormal walk, or overuse (this is common in runners) of the painful foot or feet. It is also common in aerobic exercise dancers and ballet dancers. SYMPTOMS  Most people with plantar fasciitis complain of:  Severe pain in the morning on the bottom of their foot especially when taking the first steps out of bed. This pain recedes after a few minutes of walking.  Severe pain is experienced also during walking  following a long period of inactivity.  Pain is worse when walking barefoot or up stairs DIAGNOSIS   Your caregiver will diagnose this condition by examining and feeling your foot.  Special tests such as X-rays of your foot, are usually not needed. PREVENTION   Consult a sports medicine professional before beginning a new exercise program.  Walking programs offer a good workout. With walking there is a lower chance of overuse injuries common to runners. There is less impact and less jarring of the joints.  Begin all new exercise programs slowly. If problems or pain develop, decrease the amount of time or distance until you are at a comfortable level.  Wear good shoes and replace them regularly.  Stretch your foot and the heel cords at the back of the ankle (Achilles tendon) both before and after exercise.  Run or exercise on even surfaces that are not hard. For example, asphalt is better than pavement.  Do not run barefoot on hard surfaces.  If using a treadmill, vary the incline.  Do not continue to workout if you have foot or joint  problems. Seek professional help if they do not improve. HOME CARE INSTRUCTIONS   Avoid activities that cause you pain until you recover.  Use ice or cold packs on the problem or painful areas after working out.  Only take over-the-counter or prescription medicines for pain, discomfort, or fever as directed by your caregiver.  Soft shoe inserts or athletic shoes with air or gel sole cushions may be helpful.  If problems continue or become more severe, consult a sports medicine caregiver or your own health care provider. Cortisone is a potent anti-inflammatory medication that may be injected into the painful area. You can discuss this treatment with your caregiver. MAKE SURE YOU:   Understand these instructions.  Will watch your condition.  Will get help right away if you are not doing well or get worse. Document Released: 09/29/2000 Document  Revised: 03/29/2011 Document Reviewed: 11/29/2007 [facility] Patient Information 2013 [place], [place].

## 2012-04-28 NOTE — Progress Notes (Signed)
[facility]:  Office Visit  Chief Complaint:  Chief Complaint  Patient presents with  . Foot Swelling    left. for 2 days    HPI: Alyssa [name] is a 37 y.o. female who complains of left foot swelling and pain x 2 days. She works in [employer] production pl[employer] and also at [employer] 5 hrs per night, on her feet. She has had pain on bottom of foot. No NKI. She has has prior foot surgery for a cyst. She also has pain radiating from bottom of foot into her achilles. She has sharp 6/10 pain, wakes up with it. Worse after standing and walking.  Denies Diabetes . Or neuropathy.   Past Medical History  Diagnosis Date  . 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 HSV infection   . Fibroid     MYOMA  . STD (sexually transmitted disease) 08/2004    POSITIVE GC-HSVll   Past Surgical History  Procedure Laterality Date  . Hernia repair      X2  . Kidney stones  2004  . Foot surgery    . Hysteroscopy  2010    HYSTEROSCOPIC MYOMECTOMY  . Colposcopy    . Gynecologic cryosurgery    . Mouth surgery     History   Social History  . Marital Status: Single    Spouse Name: N/A    Number of Children: N/A  . Years of Education: N/A   Social History Main Topics  . Smoking status: Never Smoker   . Smokeless tobacco: None  . Alcohol Use: Yes     Comment: rare  . Drug Use: No  . Sexually Active: None   Other Topics Concern  . None   Social History Narrative  . None   Family History  Problem Relation Age of Onset  . Diabetes Mother   . Hypertension Mother   . Breast cancer Maternal Aunt     Age 6's  . Diabetes Maternal Grandmother   . Hypertension Maternal Grandmother    No Known Allergies Prior to Admission medications   Medication Sig Start Date End Date Taking? Authorizing Provider  norelgestromin-ethinyl estradiol (ORTHO EVRA) 150-20 MCG/24HR transdermal patch Place 1 patch onto the skin once a week. 10/18/11  Yes Trellis Paganini, MD  acyclovir  (ZOVIRAX) 400 MG tablet Take 1 tablet (400 mg total) by mouth 2 (two) times daily. 10/18/11   Trellis Paganini, MD  fluconazole (DIFLUCAN) 150 MG tablet Take 1 tablet (150 mg total) by mouth once. 10/25/11   Harrington Challenger, NP  metroNIDAZOLE (METROGEL VAGINAL) 0.75 % vaginal gel Place 1 Applicatorful vaginally daily. 10/18/11   Trellis Paganini, MD  norelgestromin-ethinyl estradiol (ORTHO EVRA) 150-20 MCG/24HR transdermal patch Place 1 patch onto the skin once a week. 11/11/11   Trellis Paganini, MD     ROS: The patient denies fevers, chills, night sweats, unintentional weight loss, chest pain, palpitations, wheezing, dyspnea on exertion, nausea, vomiting, abdominal pain, dysuria, hematuria, melena, numbness, weakness, or tingling.   All other systems have been reviewed and were otherwise negative with the exception of those mentioned in the HPI and as above.    PHYSICAL EXAM: Filed Vitals:   04/28/12 1555  BP: 140/90  Pulse: 77  Temp: 98 F (36.7 C)  Resp: 16   Filed Vitals:   04/28/12 1555  Height: 5' 2.5" (1.588 m)  Weight: 171 lb 12.8 oz (77.928 kg)   Body mass  index is 30.9 kg/(m^2).  General: Alert, no acute distress but tearful at the end of interview HEENT:  Normocephalic, atraumatic, oropharynx patent.  Cardiovascular:  Regular rate and rhythm, no rubs murmurs or gallops.  No Carotid bruits, radial pulse intact. No pedal edema.  Respiratory: Clear to auscultation bilaterally.  No wheezes, rales, or rhonchi.  No cyanosis, no use of accessory musculature GI: No organomegaly, abdomen is soft and non-tender, positive bowel sounds.  No masses. Skin: No rashes. Neurologic: Facial musculature symmetric. Psychiatric: Patient is appropriate throughout our interaction. Lymphatic: No cervical lymphadenopathy Musculoskeletal: Gait intact. Left foot-ankle swelling. Neg anterior drawer. Tender along plantar fascia. + DP. No erythema, no warmth. Full ROM, sensation intact, 5/5  strength   LABS: Results for orders placed in visit on 04/28/12  URIC ACID      Result Value Range   Uric Acid, Serum 2.7  2.4 - 7.0 mg/dL     EKG/XRAY:   Primary read interpreted by [facility]. Conley Rolls at [facility]. No acute fx/dislocation ? Old bone fragment/osteophyte on cuboid.  Ankle is normal   ASSESSMENT/PLAN: Encounter Diagnoses  Name Primary?  . Left foot pain Yes  . Ankle swelling, left   . Plantar fasciitis of left foot    Most likely Plantar Fasciitis Gave exercises Patient's friend just committed suicide, she is very emotional at end of OV. Declined any meds. Has support staff at work. Deneis SI/HI Rx Diclofenac F/u with [place] or podiatrist prn    Britainy Kozub PHUONG, DO 04/30/2012 8:35 AM

## 2012-05-02 ENCOUNTER — Telehealth: Payer: Self-pay | Admitting: Radiology

## 2012-05-02 NOTE — Telephone Encounter (Signed)
Message copied by Caffie Damme on Tue May 02, 2012 11:32 AM ------      Message from: Hamilton Capri P      Created: Mon May 01, 2012  5:01 PM       Uric acid normal.  ------

## 2012-05-02 NOTE — Telephone Encounter (Signed)
Called patient to advise  °

## 2012-10-23 ENCOUNTER — Ambulatory Visit (INDEPENDENT_AMBULATORY_CARE_PROVIDER_SITE_OTHER): Payer: BC Managed Care – PPO | Admitting: Gynecology

## 2012-10-23 ENCOUNTER — Encounter: Payer: Self-pay | Admitting: Gynecology

## 2012-10-23 VITALS — BP 120/78 | Ht 64.0 in | Wt 179.0 lb

## 2012-10-23 DIAGNOSIS — D251 Intramural leiomyoma of uterus: Secondary | ICD-10-CM

## 2012-10-23 DIAGNOSIS — Z13220 Encounter for screening for lipoid disorders: Secondary | ICD-10-CM

## 2012-10-23 DIAGNOSIS — Z01419 Encounter for gynecological examination (general) (routine) without abnormal findings: Secondary | ICD-10-CM

## 2012-10-23 DIAGNOSIS — Z113 Encounter for screening for infections with a predominantly sexual mode of transmission: Secondary | ICD-10-CM

## 2012-10-23 LAB — CBC WITH DIFFERENTIAL/PLATELET
Basophils Absolute: 0 10*3/uL (ref 0.0–0.1)
Basophils Relative: 0 % (ref 0–1)
MCHC: 34.4 g/dL (ref 30.0–36.0)
Monocytes Absolute: 0.5 10*3/uL (ref 0.1–1.0)
Neutro Abs: 1.6 10*3/uL — ABNORMAL LOW (ref 1.7–7.7)
Neutrophils Relative %: 29 % — ABNORMAL LOW (ref 43–77)
Platelets: 341 10*3/uL (ref 150–400)
RDW: 13.3 % (ref 11.5–15.5)

## 2012-10-23 MED ORDER — NORELGESTROMIN-ETH ESTRADIOL 150-35 MCG/24HR TD PTWK: 1.0000 | MEDICATED_PATCH | TRANSDERMAL | Status: DC

## 2012-10-23 NOTE — Progress Notes (Signed)
Alyssa [name] Feb 02, 1975 [id number]        37 y.o.  [name] for annual exam.  Former patient of Dr. Eda Paschal. Several issues noted below.  Past medical history,surgical history, medications, allergies, family history and social history were all reviewed and documented in the EPIC chart.  ROS:  Performed and pertinent positives and negatives are included in the history, assessment and plan .  Exam: Kim assistant Filed Vitals:   10/23/12 1513  BP: 120/78  Height: 5\' 4"  (1.626 m)  Weight: 179 lb (81.194 kg)   General appearance  Normal Skin grossly normal Head/Neck normal with no cervical or supraclavicular adenopathy thyroid normal Lungs  clear Cardiac RR, without RMG Abdominal  soft, nontender, without masses, organomegaly or hernia Breasts  examined lying and sitting without masses, retractions, discharge or axillary adenopathy. Pelvic  Ext/BUS/vagina  normal  Cervix  normal GC/Chlamydia  Uterus  anteverted, normal size, shape and contour, midline and mobile nontender   Adnexa  Without masses or tenderness    Anus and perineum  normal   Rectovaginal  normal sphincter tone without palpated masses or tenderness.    Assessment/Plan:  37 y.o. G12P0010 female for annual exam. Regular menses, Ortho Evra birth control  1. Contraception. Patient is using Ortho Evra doing well wants to continue. Does not smoke and is otherwise healthy. Refilled her x1 year. 2. Pap smear 2012. No Pap smear done today. History of CIN-1 status post cryosurgery 2005. Pap smears normal since then. Recommend repeat Pap smear next year at 3 year interval per current screening guidelines. 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 Has several refills of still at home with last outbreak a year ago. Will call if she needs refills. 4. STD screening. GC chlamydia screen done. Not currently sexually active. Declined serum screening such as HIV RPR and hepatitis. 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 Several small less than a centimeter myomas intramural. Exam is normal today. We'll continue with annual exams and followup. 6. Breast health. SBE monthly reviewed. Screening mammograms between 37 and 42 discussed. Had diagnostic mammography 2010 with ultrasound due to cyst in right breast. This does result in her breast exam is normal. Recommend screening mammogram at age 13. 7. Health maintenance. Baseline CBC lipid profile glucose urinalysis ordered. Followup one year, sooner as needed.  Note: This document was prepared with digital dictation and possible smart phrase technology. Any transcriptional errors that result from this process are unintentional.   [name] MD, 3:41 PM 10/23/2012

## 2012-10-23 NOTE — Patient Instructions (Signed)
Follow up in one year, sooner as needed. 

## 2012-10-24 LAB — URINALYSIS W MICROSCOPIC + REFLEX CULTURE
Glucose, UA: NEGATIVE mg/dL
Hgb urine dipstick: NEGATIVE
Leukocytes, UA: NEGATIVE
Nitrite: NEGATIVE
Protein, ur: NEGATIVE mg/dL
pH: 6 (ref 5.0–8.0)

## 2012-10-24 LAB — LIPID PANEL
Cholesterol: 153 mg/dL (ref 0–200)
VLDL: 22 mg/dL (ref 0–40)

## 2012-10-24 LAB — GC/CHLAMYDIA PROBE AMP
CT Probe RNA: NEGATIVE
GC Probe RNA: NEGATIVE

## 2012-10-24 LAB — GLUCOSE, RANDOM: Glucose, Bld: 85 mg/dL (ref 70–99)

## 2012-10-25 LAB — URINE CULTURE
Colony Count: NO GROWTH
Organism ID, Bacteria: NO GROWTH

## 2013-03-13 ENCOUNTER — Other Ambulatory Visit: Payer: Self-pay | Admitting: Obstetrics and Gynecology

## 2013-03-26 ENCOUNTER — Telehealth: Payer: Self-pay

## 2013-03-26 NOTE — Telephone Encounter (Signed)
Patient called with concern.  She uses Ortho-Evra patch. Is not sexually active. Dermatologist prescribed Prednisone for her and she recently started it. Meantime, she had some cramping on Saturday and some spotting. It was her regular time to remove patch on Sunday and she did. She was not expecting menses until Weds. But still spotting since Saturday.  She read online that it could mean you are pregnant or have an STD.  She also asked if it might mean her fibroids are coming back. She is very concerned about this few days of spotting. Ok to reassure or OV?

## 2013-03-26 NOTE — Telephone Encounter (Signed)
I called patient back to let her know Dr. TF off today and I will call her tomorrow. 

## 2013-03-26 NOTE — Telephone Encounter (Signed)
If she is not sexually active, pregnancy and STD is not an issue.  Sometimes steroids can cause irregular bleeding.  I'd recommend restart patch when due, monitor and if irregular bleeding continues then OV.Marland Kitchen

## 2013-03-27 NOTE — Telephone Encounter (Signed)
Patient informed. 

## 2013-06-22 ENCOUNTER — Other Ambulatory Visit: Payer: Self-pay | Admitting: Obstetrics and Gynecology

## 2013-09-12 ENCOUNTER — Telehealth: Payer: Self-pay | Admitting: *Deleted

## 2013-09-12 NOTE — Telephone Encounter (Signed)
Pt called requesting refill on birth control patch, I called pharmacy and she has refills at her pharmacy. I left this on pt voicemail.

## 2013-10-05 ENCOUNTER — Ambulatory Visit (INDEPENDENT_AMBULATORY_CARE_PROVIDER_SITE_OTHER): Payer: BC Managed Care – PPO

## 2013-10-05 ENCOUNTER — Ambulatory Visit (INDEPENDENT_AMBULATORY_CARE_PROVIDER_SITE_OTHER): Payer: BC Managed Care – PPO | Admitting: Internal Medicine

## 2013-10-05 VITALS — BP 136/90 | HR 83 | Temp 98.0°F | Resp 16 | Ht 63.0 in | Wt 182.2 lb

## 2013-10-05 DIAGNOSIS — M25572 Pain in left ankle and joints of left foot: Secondary | ICD-10-CM

## 2013-10-05 DIAGNOSIS — M25579 Pain in unspecified ankle and joints of unspecified foot: Secondary | ICD-10-CM

## 2013-10-05 MED ORDER — PREDNISONE 20 MG PO TABS: ORAL_TABLET | ORAL | Status: DC

## 2013-10-05 NOTE — Progress Notes (Addendum)
Subjective:    Patient ID: Alyssa [name], female    DOB: 02/11/1975, 38 y.o.   MRN: [id number] This chart was scribed for [place]. Laney Pastor, MD by Steva Colder, ED Scribe. The patient was seen in room 4 at 6:46 PM.   Chief Complaint  Patient presents with  . Ankle Pain    left ankle x 4 days    HPI Alyssa [name] is a 38 y.o. female who presents today complaining of left ankle pain onset 4 days. She states that it hurts to walk on it. She states that her foot is swollen and warm. She states that she does not know how she hurt it. She states that she was wearing wedges this Sunday and she "wiggled" in the shoe. Ok after but woke next am with swelling and pain.. She states that she came here last time this happened in 2014. Bloodwork done they ruled out gout, metabolic issues.. She denies any other associated symptoms-fever, redness, other joint involvement, rashes.   Patient Active Problem List   Diagnosis Date Noted  . Fibroid   . HSV infection   . STD (sexually transmitted disease) 08/18/2004  . CIN I (cervical intraepithelial neoplasia I) 02/19/2003   Past Medical History  Diagnosis Date  . 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 HSV infection   . Fibroid     MYOMA  . STD (sexually transmitted disease) 08/2004    POSITIVE GC-HSVll   Past Surgical History  Procedure Laterality Date  . Hernia repair      X2  . Kidney stones  2004  . Foot surgery    . Hysteroscopy  2010    HYSTEROSCOPIC MYOMECTOMY  . Gynecologic cryosurgery    . Mouth surgery     No Known Allergies Prior to Admission medications   Medication Sig Start Date End Date Taking? Authorizing Provider  norelgestromin-ethinyl estradiol (ORTHO EVRA) 150-35 MCG/24HR transdermal patch Place 1 patch onto the skin once a week.   Yes Historical Provider, MD  acyclovir (ZOVIRAX) 400 MG tablet TAKE 1 TABLET (400 MG TOTAL) BY MOUTH 2 (TWO) TIMES DAILY. 03/13/13   [name], MD    [name] 150-35 MCG/24HR transdermal patch APPLY 1 PATCH ONTO SKIN ONCE A WEEK 06/22/13   [name], MD   No underlying illnesses     Review of Systems  Constitutional: Negative for fever and chills.  HENT: Negative for ear pain.   Eyes: Negative for pain.  Respiratory: Negative for cough and shortness of breath.   Cardiovascular: Negative for chest pain.  Gastrointestinal: Negative for nausea and vomiting.  Genitourinary: Negative for frequency.  Neurological: Negative for syncope and headaches.       Objective:   Physical Exam  Nursing note and vitals reviewed. Constitutional: She is oriented to person, place, and time. She appears well-developed and well-nourished. No distress.  HENT:  Head: Normocephalic and atraumatic.  Eyes: EOM are normal.  Neck: Neck supple.  Cardiovascular: Normal rate.   Pulmonary/Chest: Effort normal. No respiratory distress.  Musculoskeletal:  The left ankle is swollen on the medial aspect moderately without redness or ecchymoses. She is tender to palpation over the entire deltoid ligament without bony tenderness except at the tip of the tibia. She has pain on any range of motion. Achilles  is nontender and not enlarged-fifth metatarsal nontender-dorsum of the foot uninvolved. First MTP not swollen. Has pain with weightbearing. No ankle laxity  Neurological: She  is alert and oriented to person, place, and time.  Skin: Skin is warm and dry.  Psychiatric: She has a normal mood and affect. Her behavior is normal.      [facility] reading (PRIMARY) by  Dr. Lashann Hagg=ankle wnl.     BP 136/90  Pulse 83  Temp(Src) 98 F (36.7 C) (Oral)  Resp 16  Ht 5\' 3"  (1.6 m)  Wt 182 lb 3.2 oz (82.645 kg)  BMI 32.28 kg/m2  SpO2 100%  LMP 09/10/2013  Assessment & Plan:  I personally performed the services described in this documentation, which was scribed in my presence. The recorded information has been reviewed and is accurate.   Pain and swelling left  ankle--- this fits with an inflammatory arthropathy although her lab values suggest she does not have gout or metabolic problem like diabetes. She has no other joint involvement to suggest rheumatoid arthritis. There is no fever to suggest a systemic infection. No rashes to also suggest that. CPPD possible but age argues against.  Plan  Cam Walker Ice and elevate all weekend Prednisone 60 mg with six-day taper Recheck in 72 hours--

## 2013-10-18 DIAGNOSIS — Z8742 Personal history of other diseases of the female genital tract: Secondary | ICD-10-CM

## 2013-10-18 HISTORY — DX: Personal history of other diseases of the female genital tract: Z87.42

## 2013-10-25 ENCOUNTER — Ambulatory Visit (INDEPENDENT_AMBULATORY_CARE_PROVIDER_SITE_OTHER): Payer: BC Managed Care – PPO | Admitting: Gynecology

## 2013-10-25 ENCOUNTER — Other Ambulatory Visit (HOSPITAL_COMMUNITY)
Admission: RE | Admit: 2013-10-25 | Discharge: 2013-10-25 | Disposition: A | Discharge status: Home / Self Care | Payer: BC Managed Care – PPO | Source: Ambulatory Visit | Attending: Gynecology | Admitting: Gynecology

## 2013-10-25 ENCOUNTER — Encounter: Payer: Self-pay | Admitting: Gynecology

## 2013-10-25 VITALS — BP 120/76 | Ht 63.5 in | Wt 180.0 lb

## 2013-10-25 DIAGNOSIS — Z01419 Encounter for gynecological examination (general) (routine) without abnormal findings: Secondary | ICD-10-CM

## 2013-10-25 DIAGNOSIS — R87810 Cervical high risk human papillomavirus (HPV) DNA test positive: Secondary | ICD-10-CM | POA: Insufficient documentation

## 2013-10-25 DIAGNOSIS — A609 Anogenital herpesviral infection, unspecified: Secondary | ICD-10-CM

## 2013-10-25 DIAGNOSIS — D251 Intramural leiomyoma of uterus: Secondary | ICD-10-CM

## 2013-10-25 DIAGNOSIS — Z1151 Encounter for screening for human papillomavirus (HPV): Secondary | ICD-10-CM | POA: Insufficient documentation

## 2013-10-25 DIAGNOSIS — Z124 Encounter for screening for malignant neoplasm of cervix: Secondary | ICD-10-CM

## 2013-10-25 LAB — CBC WITH DIFFERENTIAL/PLATELET
BASOS PCT: 0 % (ref 0–1)
Basophils Absolute: 0 10*3/uL (ref 0.0–0.1)
EOS ABS: 0.2 10*3/uL (ref 0.0–0.7)
Eosinophils Relative: 4 % (ref 0–5)
HEMATOCRIT: 37.8 % (ref 36.0–46.0)
Hemoglobin: 13.2 g/dL (ref 12.0–15.0)
Lymphocytes Relative: 43 % (ref 12–46)
Lymphs Abs: 2.2 10*3/uL (ref 0.7–4.0)
MCH: 29.7 pg (ref 26.0–34.0)
MCHC: 34.9 g/dL (ref 30.0–36.0)
MCV: 85.1 fL (ref 78.0–100.0)
MONO ABS: 0.5 10*3/uL (ref 0.1–1.0)
Monocytes Relative: 9 % (ref 3–12)
Neutro Abs: 2.2 10*3/uL (ref 1.7–7.7)
Neutrophils Relative %: 44 % (ref 43–77)
Platelets: 309 10*3/uL (ref 150–400)
RBC: 4.44 MIL/uL (ref 3.87–5.11)
RDW: 13.8 % (ref 11.5–15.5)
WBC: 5 10*3/uL (ref 4.0–10.5)

## 2013-10-25 LAB — URINALYSIS W MICROSCOPIC + REFLEX CULTURE
Bilirubin Urine: NEGATIVE
Casts: NONE SEEN
Crystals: NONE SEEN
Glucose, UA: NEGATIVE mg/dL
HGB URINE DIPSTICK: NEGATIVE
KETONES UR: NEGATIVE mg/dL
NITRITE: NEGATIVE
PROTEIN: NEGATIVE mg/dL
Specific Gravity, Urine: 1.021 (ref 1.005–1.030)
Urobilinogen, UA: 0.2 mg/dL (ref 0.0–1.0)
pH: 6 (ref 5.0–8.0)

## 2013-10-25 LAB — COMPREHENSIVE METABOLIC PANEL
ALK PHOS: 28 U/L — AB (ref 39–117)
ALT: 13 U/L (ref 0–35)
AST: 14 U/L (ref 0–37)
Albumin: 3.7 g/dL (ref 3.5–5.2)
BILIRUBIN TOTAL: 0.4 mg/dL (ref 0.2–1.2)
BUN: 7 mg/dL (ref 6–23)
CO2: 27 mEq/L (ref 19–32)
Calcium: 9.2 mg/dL (ref 8.4–10.5)
Chloride: 103 mEq/L (ref 96–112)
Creat: 0.73 mg/dL (ref 0.50–1.10)
Glucose, Bld: 67 mg/dL — ABNORMAL LOW (ref 70–99)
POTASSIUM: 4.3 meq/L (ref 3.5–5.3)
Sodium: 139 mEq/L (ref 135–145)
Total Protein: 6.5 g/dL (ref 6.0–8.3)

## 2013-10-25 LAB — LIPID PANEL
CHOL/HDL RATIO: 3.1 ratio
Cholesterol: 157 mg/dL (ref 0–200)
HDL: 51 mg/dL (ref >39)
LDL CALC: 85 mg/dL (ref 0–99)
Triglycerides: 107 mg/dL (ref <150)
VLDL: 21 mg/dL (ref 0–40)

## 2013-10-25 MED ORDER — NORELGESTROMIN-ETH ESTRADIOL 150-35 MCG/24HR TD PTWK: MEDICATED_PATCH | TRANSDERMAL | Status: DC

## 2013-10-25 NOTE — Patient Instructions (Signed)
You may obtain a copy of any labs that were done today by logging onto MyChart as outlined in the instructions provided with your AVS (after visit summary). The office will not call with normal lab results but certainly if there are any significant abnormalities then we will contact you.   Health Maintenance, Female A healthy lifestyle and preventative care can promote health and wellness.  Maintain regular health, dental, and eye exams.  Eat a healthy diet. Foods like vegetables, fruits, whole grains, low-fat dairy products, and lean protein foods contain the nutrients you need without too many calories. Decrease your intake of foods high in solid fats, added sugars, and salt. Get information about a proper diet from your caregiver, if necessary.  Regular physical exercise is one of the most important things you can do for your health. Most adults should get at least 150 minutes of moderate-intensity exercise (any activity that increases your heart rate and causes you to sweat) each week. In addition, most adults need muscle-strengthening exercises on 2 or more days a week.   Maintain a healthy weight. The body mass index (BMI) is a screening tool to identify possible weight problems. It provides an estimate of body fat based on height and weight. Your caregiver can help determine your BMI, and can help you achieve or maintain a healthy weight. For adults 20 years and older:  A BMI below 18.5 is considered underweight.  A BMI of 18.5 to 24.9 is normal.  A BMI of 25 to 29.9 is considered overweight.  A BMI of 30 and above is considered obese.  Maintain normal blood lipids and cholesterol by exercising and minimizing your intake of saturated fat. Eat a balanced diet with plenty of fruits and vegetables. Blood tests for lipids and cholesterol should begin at age 61 and be repeated every 5 years. If your lipid or cholesterol levels are high, you are over 50, or you are a high risk for heart  disease, you may need your cholesterol levels checked more frequently.Ongoing high lipid and cholesterol levels should be treated with medicines if diet and exercise are not effective.  If you smoke, find out from your caregiver how to quit. If you do not use tobacco, do not start.  Lung cancer screening is recommended for adults aged 33 80 years who are at high risk for developing lung cancer because of a history of smoking. Yearly low-dose computed tomography (CT) is recommended for people who have at least a 30-pack-year history of smoking and are a current smoker or have quit within the past 15 years. A pack year of smoking is smoking an average of 1 pack of cigarettes a day for 1 year (for example: 1 pack a day for 30 years or 2 packs a day for 15 years). Yearly screening should continue until the smoker has stopped smoking for at least 15 years. Yearly screening should also be stopped for people who develop a health problem that would prevent them from having lung cancer treatment.  If you are pregnant, do not drink alcohol. If you are breastfeeding, be very cautious about drinking alcohol. If you are not pregnant and choose to drink alcohol, do not exceed 1 drink per day. One drink is considered to be 12 ounces (355 mL) of beer, 5 ounces (148 mL) of wine, or 1.5 ounces (44 mL) of liquor.  Avoid use of street drugs. Do not share needles with anyone. Ask for help if you need support or instructions about stopping  the use of drugs.  High blood pressure causes heart disease and increases the risk of stroke. Blood pressure should be checked at least every 1 to 2 years. Ongoing high blood pressure should be treated with medicines, if weight loss and exercise are not effective.  If you are 59 to 36 years old, ask your caregiver if you should take aspirin to prevent strokes.  Diabetes screening involves taking a blood sample to check your fasting blood sugar level. This should be done once every 3  years, after age 91, if you are within normal weight and without risk factors for diabetes. Testing should be considered at a younger age or be carried out more frequently if you are overweight and have at least 1 risk factor for diabetes.  Breast cancer screening is essential preventative care for women. You should practice "breast self-awareness." This means understanding the normal appearance and feel of your breasts and may include breast self-examination. Any changes detected, no matter how small, should be reported to a caregiver. Women in their 66s and 30s should have a clinical breast exam (CBE) by a caregiver as part of a regular health exam every 1 to 3 years. After age 101, women should have a CBE every year. Starting at age 100, women should consider having a mammogram (breast X-ray) every year. Women who have a family history of breast cancer should talk to their caregiver about genetic screening. Women at a high risk of breast cancer should talk to their caregiver about having an MRI and a mammogram every year.  Breast cancer gene (BRCA)-related cancer risk assessment is recommended for women who have family members with BRCA-related cancers. BRCA-related cancers include breast, ovarian, tubal, and peritoneal cancers. Having family members with these cancers may be associated with an increased risk for harmful changes (mutations) in the breast cancer genes BRCA1 and BRCA2. Results of the assessment will determine the need for genetic counseling and BRCA1 and BRCA2 testing.  The Pap test is a screening test for cervical cancer. Women should have a Pap test starting at age 57. Between ages 25 and 35, Pap tests should be repeated every 2 years. Beginning at age 37, you should have a Pap test every 3 years as long as the past 3 Pap tests have been normal. If you had a hysterectomy for a problem that was not cancer or a condition that could lead to cancer, then you no longer need Pap tests. If you are  between ages 50 and 76, and you have had normal Pap tests going back 10 years, you no longer need Pap tests. If you have had past treatment for cervical cancer or a condition that could lead to cancer, you need Pap tests and screening for cancer for at least 20 years after your treatment. If Pap tests have been discontinued, risk factors (such as a new sexual partner) need to be reassessed to determine if screening should be resumed. Some women have medical problems that increase the chance of getting cervical cancer. In these cases, your caregiver may recommend more frequent screening and Pap tests.  The human papillomavirus (HPV) test is an additional test that may be used for cervical cancer screening. The HPV test looks for the virus that can cause the cell changes on the cervix. The cells collected during the Pap test can be tested for HPV. The HPV test could be used to screen women aged 44 years and older, and should be used in women of any age  who have unclear Pap test results. After the age of 55, women should have HPV testing at the same frequency as a Pap test.  Colorectal cancer can be detected and often prevented. Most routine colorectal cancer screening begins at the age of 44 and continues through age 20. However, your caregiver may recommend screening at an earlier age if you have risk factors for colon cancer. On a yearly basis, your caregiver may provide home test kits to check for hidden blood in the stool. Use of a small camera at the end of a tube, to directly examine the colon (sigmoidoscopy or colonoscopy), can detect the earliest forms of colorectal cancer. Talk to your caregiver about this at age 86, when routine screening begins. Direct examination of the colon should be repeated every 5 to 10 years through age 13, unless early forms of pre-cancerous polyps or small growths are found.  Hepatitis C blood testing is recommended for all people born from [birth year] through 1965 and any  individual with known risks for hepatitis C.  Practice safe sex. Use condoms and avoid high-risk sexual practices to reduce the spread of sexually transmitted infections (STIs). Sexually active women aged 36 and younger should be checked for Chlamydia, which is a common sexually transmitted infection. Older women with new or multiple partners should also be tested for Chlamydia. Testing for other STIs is recommended if you are sexually active and at increased risk.  Osteoporosis is a disease in which the bones lose minerals and strength with aging. This can result in serious bone fractures. The risk of osteoporosis can be identified using a bone density scan. Women ages 20 and over and women at risk for fractures or osteoporosis should discuss screening with their caregivers. Ask your caregiver whether you should be taking a calcium supplement or vitamin D to reduce the rate of osteoporosis.  Menopause can be associated with physical symptoms and risks. Hormone replacement therapy is available to decrease symptoms and risks. You should talk to your caregiver about whether hormone replacement therapy is right for you.  Use sunscreen. Apply sunscreen liberally and repeatedly throughout the day. You should seek shade when your shadow is shorter than you. Protect yourself by wearing long sleeves, pants, a wide-brimmed hat, and sunglasses year round, whenever you are outdoors.  Notify your caregiver of new moles or changes in moles, especially if there is a change in shape or color. Also notify your caregiver if a mole is larger than the size of a pencil eraser.  Stay current with your immunizations. Document Released: 07/20/2010 Document Revised: 05/01/2012 Document Reviewed: 07/20/2010 [facility] Patient Information 2014 [place].

## 2013-10-25 NOTE — Progress Notes (Addendum)
Alyssa [name] 1975/02/20 [id number]        38 y.o.  [name] for annual exam.  Several issues noted below.  Past medical history,surgical history, problem list, medications, allergies, family history and social history were all reviewed and documented as reviewed in the EPIC chart.  ROS:  12 system ROS performed with pertinent positives and negatives included in the history, assessment and plan.   Additional significant findings :  none   Exam: Kim Counsellor Vitals:   10/25/13 0858  BP: 120/76  Height: 5' 3.5" (1.613 m)  Weight: 180 lb (81.647 kg)   General appearance:  Normal affect, orientation and appearance. Skin: Grossly normal HEENT: Without gross lesions.  No cervical or supraclavicular adenopathy. Thyroid normal.  Lungs:  Clear without wheezing, rales or rhonchi Cardiac: RR, without RMG Abdominal:  Soft, nontender, without masses, guarding, rebound, organomegaly or hernia Breasts:  Examined lying and sitting without masses, retractions, discharge or axillary adenopathy. Pelvic:  Ext/BUS/vagina normal  Cervix normal. Pap/HPV  Uterus anteverted, normal size, shape and contour, midline and mobile nontender   Adnexa  Without masses or tenderness    Anus and perineum  Normal   Rectovaginal  Normal sphincter tone without palpated masses or tenderness.    Assessment/Plan:  37 y.o. G26P0010 female for annual exam with regular menses, Ortho Evra contraception.   1. Contraception. Patient continues on Ortho Evra doing well. Does not smoke and not being followed for any medical issues. Wants to continue. Reviewed risks of blood clot such as stroke heart attack DVT. Patient's comfortable with continuing I refilled her x1 year. 2. Pap smear 2012. Pap/HPV done today. History of CIN-1 status post cryosurgery 2005. Normal Pap smears since then. Plan repeat Pap smear at 3-5 year interval per current screening guidelines assuming this Pap smears normal. 3. History of genital HSV. Has  not had an outbreak for years. Will call if she needs Zovirax but does not want a prescription now. 4. STD screening. Had done in the past. Patient not sexually active and declines STD screening. 5. Leiomyoma. History of hysteroscopic submucous myomectomy. Several small intramural myomas noted on prior ultrasound. Exam is normal today. Will continue with annual exams to follow. 6. Mammography. Screening mammographic recommendations between 59 and 40 reviewed. Has a prior history of mammography 2010. Prefers to wait closer to 40.  SBE monthly reviewed. 7. Health maintenance. Baseline CBC comprehensive metabolic panel lipid profile urinalysis ordered. Follow up in one year, sooner as needed.     [name] MD, 9:34 AM 10/25/2013

## 2013-10-25 NOTE — Addendum Note (Signed)
Addended by: Nelva Nay on: 10/25/2013 09:46 AM   Modules accepted: Orders

## 2013-10-26 LAB — URINE CULTURE: Colony Count: >=100000

## 2013-10-26 LAB — CYTOLOGY - PAP

## 2013-10-29 ENCOUNTER — Other Ambulatory Visit: Payer: Self-pay | Admitting: Gynecology

## 2013-10-29 MED ORDER — FLUCONAZOLE 150 MG PO TABS: 150.0000 mg | ORAL_TABLET | Freq: Once | ORAL | Status: DC

## 2013-10-29 MED ORDER — NITROFURANTOIN MONOHYD MACRO 100 MG PO CAPS: 100.0000 mg | ORAL_CAPSULE | Freq: Two times a day (BID) | ORAL | Status: DC

## 2013-11-01 ENCOUNTER — Encounter: Payer: Self-pay | Admitting: Gynecology

## 2013-11-19 ENCOUNTER — Encounter: Payer: Self-pay | Admitting: Gynecology

## 2013-12-06 ENCOUNTER — Telehealth: Payer: Self-pay | Admitting: *Deleted

## 2013-12-06 NOTE — Telephone Encounter (Signed)
Pt takes Ortho Evra patch took patch off on Sunday, cycle normal starts on Wednesday, no cycle yet. Pt is not sexually active, asked if okay to place a new patch on Sunday even if no cycle? Pt said she did receive a steroid shot by her dermatologist. Please advise

## 2013-12-07 NOTE — Telephone Encounter (Signed)
Pt informed with the below note. 

## 2013-12-07 NOTE — Telephone Encounter (Signed)
As long as no chance of pregnancy I would go ahead and start the new patch on time

## 2014-02-19 ENCOUNTER — Ambulatory Visit (INDEPENDENT_AMBULATORY_CARE_PROVIDER_SITE_OTHER): Payer: BLUE CROSS/BLUE SHIELD | Admitting: Women's Health

## 2014-02-19 ENCOUNTER — Encounter: Payer: Self-pay | Admitting: Women's Health

## 2014-02-19 ENCOUNTER — Telehealth: Payer: Self-pay | Admitting: *Deleted

## 2014-02-19 VITALS — BP 122/80 | Ht 63.0 in | Wt 175.0 lb

## 2014-02-19 DIAGNOSIS — B9689 Other specified bacterial agents as the cause of diseases classified elsewhere: Secondary | ICD-10-CM

## 2014-02-19 DIAGNOSIS — N760 Acute vaginitis: Secondary | ICD-10-CM

## 2014-02-19 DIAGNOSIS — N898 Other specified noninflammatory disorders of vagina: Secondary | ICD-10-CM

## 2014-02-19 DIAGNOSIS — A499 Bacterial infection, unspecified: Secondary | ICD-10-CM

## 2014-02-19 DIAGNOSIS — R1031 Right lower quadrant pain: Secondary | ICD-10-CM

## 2014-02-19 DIAGNOSIS — R350 Frequency of micturition: Secondary | ICD-10-CM

## 2014-02-19 LAB — WET PREP FOR TRICH, YEAST, CLUE
Trich, Wet Prep: NONE SEEN
YEAST WET PREP: NONE SEEN

## 2014-02-19 LAB — URINALYSIS W MICROSCOPIC + REFLEX CULTURE
Bilirubin Urine: NEGATIVE
Casts: NONE SEEN
Crystals: NONE SEEN
GLUCOSE, UA: NEGATIVE mg/dL
KETONES UR: NEGATIVE mg/dL
Nitrite: NEGATIVE
PROTEIN: 30 mg/dL — AB
Specific Gravity, Urine: 1.02 (ref 1.005–1.030)
UROBILINOGEN UA: 0.2 mg/dL (ref 0.0–1.0)
pH: 6 (ref 5.0–8.0)

## 2014-02-19 MED ORDER — METRONIDAZOLE 0.75 % VA GEL: VAGINAL | Status: DC

## 2014-02-19 MED ORDER — SULFAMETHOXAZOLE-TRIMETHOPRIM 800-160 MG PO TABS: 1.0000 | ORAL_TABLET | Freq: Two times a day (BID) | ORAL | Status: DC

## 2014-02-19 NOTE — Telephone Encounter (Signed)
Pt was seen today given Septra twice daily for 3 days #6. MetroGel vaginal cream 1 applicator at bedtime 5. Pt asked if Rx for yeast could be sent to have on hand incase this should occur after taking antibiotics. Please advise

## 2014-02-19 NOTE — Progress Notes (Signed)
Patient ID: Alyssa [name], female   DOB: Apr 09, 1975, 39 y.o.   MRN: [id number] Presents with complaint of urinary pressure, frequency, urgency for 5 days. Poor sleep last night due to urinary pressure and frequency. Minimal abdominal pain today, intense lower right quadrant pain 2 days ago with some relief with a heating pad. Denies back pain or fever. History of kidney stones, fibroids with myomectomy. Monthly cycle not sexually active for years.  Exam: Appears well. Abdomen soft, obese, no rebound or radiation of pain. External genitalia within normal limits, speculum exam moderate amount of milky adherent discharge with odor noted, wet prep positive for amines, clues, TNTC bacteria. Bimanual no CMT or adnexal tenderness or fullness with exam. UA: Large blood, small leukocytes, 7-10 WBCs, TNTC RBCs, many bacteria and many squamous epithelials.  UTI with hematuria Bacteria vaginosis  Plan: Urine culture pending. Septra twice daily for 3 days #6. MetroGel vaginal cream 1 applicator at bedtime 5, alcohol precautions reviewed, UTI prevention discussed. Instructed to call if no relief of symptoms. Schedule ultrasound after next cycle to assess fibroids.

## 2014-02-19 NOTE — Patient Instructions (Signed)
Bacterial Vaginosis Bacterial vaginosis is an infection of the vagina. It happens when too many of certain germs (bacteria) grow in the vagina. HOME CARE  Take your medicine as told by your doctor.  Finish your medicine even if you start to feel better.  Do not have sex until you finish your medicine and are better.  Tell your sex partner that you have an infection. They should see their doctor for treatment.  Practice safe sex. Use condoms. Have only one sex partner. GET HELP IF:  You are not getting better after 3 days of treatment.  You have more grey fluid (discharge) coming from your vagina than before.  You have more pain than before.  You have a fever. MAKE SURE YOU:   Understand these instructions.  Will watch your condition.  Will get help right away if you are not doing well or get worse. Document Released: 10/14/2007 Document Revised: 10/25/2012 Document Reviewed: 08/16/2012 [facility] Patient Information 2015 [place], [place]. This information is not intended to replace advice given to you by your health care provider. Make sure you discuss any questions you have with your health care provider. Urinary Tract Infection Urinary tract infections (UTIs) can develop anywhere along your urinary tract. Your urinary tract is your body's drainage system for removing wastes and extra water. Your urinary tract includes two kidneys, two ureters, a bladder, and a urethra. Your kidneys are a pair of bean-shaped organs. Each kidney is about the size of your fist. They are located below your ribs, one on each side of your spine. CAUSES Infections are caused by microbes, which are microscopic organisms, including fungi, viruses, and bacteria. These organisms are so small that they can only be seen through a microscope. Bacteria are the microbes that most commonly cause UTIs. SYMPTOMS  Symptoms of UTIs may vary by age and gender of the patient and by the location of the infection. Symptoms  in young women typically include a frequent and intense urge to urinate and a painful, burning feeling in the bladder or urethra during urination. Older women and men are more likely to be tired, shaky, and weak and have muscle aches and abdominal pain. A fever may mean the infection is in your kidneys. Other symptoms of a kidney infection include pain in your back or sides below the ribs, nausea, and vomiting. DIAGNOSIS To diagnose a UTI, your caregiver will ask you about your symptoms. Your caregiver also will ask to provide a urine sample. The urine sample will be tested for bacteria and white blood cells. White blood cells are made by your body to help fight infection. TREATMENT  Typically, UTIs can be treated with medication. Because most UTIs are caused by a bacterial infection, they usually can be treated with the use of antibiotics. The choice of antibiotic and length of treatment depend on your symptoms and the type of bacteria causing your infection. HOME CARE INSTRUCTIONS  If you were prescribed antibiotics, take them exactly as your caregiver instructs you. Finish the medication even if you feel better after you have only taken some of the medication.  Drink enough water and fluids to keep your urine clear or pale yellow.  Avoid caffeine, tea, and carbonated beverages. They tend to irritate your bladder.  Empty your bladder often. Avoid holding urine for long periods of time.  Empty your bladder before and after sexual intercourse.  After a bowel movement, women should cleanse from front to back. Use each tissue only once. [place]  IF:   You have back pain.  You develop a fever.  Your symptoms do not begin to resolve within 3 days. SEEK IMMEDIATE MEDICAL CARE IF:   You have severe back pain or lower abdominal pain.  You develop chills.  You have nausea or vomiting.  You have continued burning or discomfort with urination. MAKE SURE YOU:   Understand these  instructions.  Will watch your condition.  Will get help right away if you are not doing well or get worse. Document Released: 10/14/2004 Document Revised: 07/06/2011 Document Reviewed: 02/12/2011 [facility] Patient Information 2015 [place], [place]. This information is not intended to replace advice given to you by your health care provider. Make sure you discuss any questions you have with your health care provider.

## 2014-02-19 NOTE — Telephone Encounter (Signed)
Okay, Diflucan 150 mg one dose.

## 2014-02-20 ENCOUNTER — Ambulatory Visit: Payer: Self-pay | Admitting: Gynecology

## 2014-02-20 MED ORDER — FLUCONAZOLE 150 MG PO TABS: 150.0000 mg | ORAL_TABLET | Freq: Once | ORAL | Status: DC

## 2014-02-20 NOTE — Telephone Encounter (Signed)
PT AWARE, [place]

## 2014-02-21 LAB — URINE CULTURE: Colony Count: 50000

## 2014-02-22 ENCOUNTER — Telehealth: Payer: Self-pay | Admitting: *Deleted

## 2014-02-22 NOTE — Telephone Encounter (Signed)
Pt called to follow up from [place] on 02/19/14 states she completed the bactum Rx still taking Metrogel. C/o feeling "funny" in her vagina, no pain, just a funny feeling. I advised pt to watch for now, no true symptoms. Pt will follow up if needed.

## 2014-03-05 ENCOUNTER — Other Ambulatory Visit: Payer: Self-pay | Admitting: Women's Health

## 2014-03-05 DIAGNOSIS — D259 Leiomyoma of uterus, unspecified: Secondary | ICD-10-CM

## 2014-03-11 ENCOUNTER — Ambulatory Visit (INDEPENDENT_AMBULATORY_CARE_PROVIDER_SITE_OTHER): Payer: BLUE CROSS/BLUE SHIELD

## 2014-03-11 ENCOUNTER — Ambulatory Visit (INDEPENDENT_AMBULATORY_CARE_PROVIDER_SITE_OTHER): Payer: BLUE CROSS/BLUE SHIELD | Admitting: Gynecology

## 2014-03-11 ENCOUNTER — Encounter: Payer: Self-pay | Admitting: Gynecology

## 2014-03-11 ENCOUNTER — Other Ambulatory Visit: Payer: Self-pay | Admitting: Women's Health

## 2014-03-11 DIAGNOSIS — D251 Intramural leiomyoma of uterus: Secondary | ICD-10-CM

## 2014-03-11 DIAGNOSIS — D259 Leiomyoma of uterus, unspecified: Secondary | ICD-10-CM

## 2014-03-11 DIAGNOSIS — D252 Subserosal leiomyoma of uterus: Secondary | ICD-10-CM

## 2014-03-11 NOTE — Patient Instructions (Signed)
Follow up this coming fall for your annual exam when due.

## 2014-03-11 NOTE — Progress Notes (Signed)
Alyssa [name] May 21, 1975 [id number]        39 y.o.  [name] Presents for follow up ultrasound as ordered by Izora Gala. Was recently seen and treated for a UTI and bacterial vaginosis. No set her discomfort and other symptoms have all resolved. Izora Gala ordered an ultrasound given her past history of leiomyoma for stability check. Her pelvic exam was grossly normal at her annual exam this past October.  Past medical history,surgical history, problem list, medications, allergies, family history and social history were all reviewed and documented in the EPIC chart.  Directed ROS with pertinent positives and negatives documented in the history of present illness/assessment and plan.  Ultrasound shows uterus normal size and echotexture. Several small myomas noted measuring 30 mm, 29 mm and 15 mm. Endometrial echo 2.3 mm.  Right and left ovaries are grossly normal. Cul-de-sac with minimal fluid.  Assessment/Plan:  39 y.o. [name] with ultrasound showing several small myomas. Patient asymptomatic from these. Reviewed findings of the ultrasound with the patient.  Plan continued observation and follow up in the fall when she is due for her annual exam.     [name] MD, 12:11 PM 03/11/2014

## 2014-11-22 ENCOUNTER — Ambulatory Visit (INDEPENDENT_AMBULATORY_CARE_PROVIDER_SITE_OTHER): Payer: BLUE CROSS/BLUE SHIELD | Admitting: Gynecology

## 2014-11-22 ENCOUNTER — Other Ambulatory Visit (HOSPITAL_COMMUNITY)
Admission: RE | Admit: 2014-11-22 | Discharge: 2014-11-22 | Disposition: A | Discharge status: Home / Self Care | Payer: BLUE CROSS/BLUE SHIELD | Source: Ambulatory Visit | Attending: Gynecology | Admitting: Gynecology

## 2014-11-22 ENCOUNTER — Encounter: Payer: Self-pay | Admitting: Gynecology

## 2014-11-22 VITALS — BP 126/78 | Ht 64.0 in | Wt 177.0 lb

## 2014-11-22 DIAGNOSIS — Z01419 Encounter for gynecological examination (general) (routine) without abnormal findings: Secondary | ICD-10-CM

## 2014-11-22 DIAGNOSIS — Z1151 Encounter for screening for human papillomavirus (HPV): Secondary | ICD-10-CM | POA: Insufficient documentation

## 2014-11-22 DIAGNOSIS — Z13220 Encounter for screening for lipoid disorders: Secondary | ICD-10-CM | POA: Diagnosis not present

## 2014-11-22 DIAGNOSIS — R87810 Cervical high risk human papillomavirus (HPV) DNA test positive: Secondary | ICD-10-CM | POA: Diagnosis not present

## 2014-11-22 DIAGNOSIS — A609 Anogenital herpesviral infection, unspecified: Secondary | ICD-10-CM

## 2014-11-22 LAB — CBC WITH DIFFERENTIAL/PLATELET
BASOS PCT: 0 % (ref 0–1)
Basophils Absolute: 0 10*3/uL (ref 0.0–0.1)
EOS PCT: 3 % (ref 0–5)
Eosinophils Absolute: 0.1 10*3/uL (ref 0.0–0.7)
HEMATOCRIT: 36.7 % (ref 36.0–46.0)
Hemoglobin: 12.9 g/dL (ref 12.0–15.0)
Lymphocytes Relative: 53 % — ABNORMAL HIGH (ref 12–46)
Lymphs Abs: 2.3 10*3/uL (ref 0.7–4.0)
MCH: 30.3 pg (ref 26.0–34.0)
MCHC: 35.1 g/dL (ref 30.0–36.0)
MCV: 86.2 fL (ref 78.0–100.0)
MONO ABS: 0.4 10*3/uL (ref 0.1–1.0)
MPV: 9.8 fL (ref 8.6–12.4)
Monocytes Relative: 8 % (ref 3–12)
Neutro Abs: 1.6 10*3/uL — ABNORMAL LOW (ref 1.7–7.7)
Neutrophils Relative %: 36 % — ABNORMAL LOW (ref 43–77)
Platelets: 305 10*3/uL (ref 150–400)
RBC: 4.26 MIL/uL (ref 3.87–5.11)
RDW: 13.6 % (ref 11.5–15.5)
WBC: 4.4 10*3/uL (ref 4.0–10.5)

## 2014-11-22 LAB — COMPREHENSIVE METABOLIC PANEL
ALT: 7 U/L (ref 6–29)
AST: 12 U/L (ref 10–30)
Albumin: 3.4 g/dL — ABNORMAL LOW (ref 3.6–5.1)
Alkaline Phosphatase: 32 U/L — ABNORMAL LOW (ref 33–115)
BILIRUBIN TOTAL: 0.3 mg/dL (ref 0.2–1.2)
BUN: 9 mg/dL (ref 7–25)
CALCIUM: 8.5 mg/dL — AB (ref 8.6–10.2)
CO2: 25 mmol/L (ref 20–31)
Chloride: 105 mmol/L (ref 98–110)
Creat: 0.7 mg/dL (ref 0.50–1.10)
GLUCOSE: 83 mg/dL (ref 65–99)
Potassium: 4.2 mmol/L (ref 3.5–5.3)
Sodium: 137 mmol/L (ref 135–146)
Total Protein: 6.2 g/dL (ref 6.1–8.1)

## 2014-11-22 LAB — LIPID PANEL
CHOL/HDL RATIO: 3 ratio (ref <=5.0)
Cholesterol: 138 mg/dL (ref 125–200)
HDL: 46 mg/dL (ref >=46)
LDL CALC: 77 mg/dL (ref <130)
Triglycerides: 75 mg/dL (ref <150)
VLDL: 15 mg/dL (ref <30)

## 2014-11-22 MED ORDER — NORELGESTROMIN-ETH ESTRADIOL 150-35 MCG/24HR TD PTWK: MEDICATED_PATCH | TRANSDERMAL | Status: DC

## 2014-11-22 MED ORDER — VALACYCLOVIR HCL 500 MG PO TABS: 500.0000 mg | ORAL_TABLET | Freq: Two times a day (BID) | ORAL | Status: DC

## 2014-11-22 NOTE — Addendum Note (Signed)
Addended by: Nelva Nay on: 11/22/2014 08:58 AM   Modules accepted: Orders

## 2014-11-22 NOTE — Patient Instructions (Signed)

## 2014-11-22 NOTE — Progress Notes (Signed)
Alyssa [name] Jan 10, 1976 [id number]        38 y.o.  [name]  Patient's last menstrual period was 11/08/2014. for annual exam.  Doing well.  Past medical history,surgical history, problem list, medications, allergies, family history and social history were all reviewed and documented as reviewed in the EPIC chart.  ROS:  Performed with pertinent positives and negatives included in the history, assessment and plan.   Additional significant findings :  none   Exam: Kim Counsellor Vitals:   11/22/14 0829  BP: 126/78  Height: 5\' 4"  (1.626 m)  Weight: 177 lb (80.287 kg)   General appearance:  Normal affect, orientation and appearance. Skin: Grossly normal HEENT: Without gross lesions.  No cervical or supraclavicular adenopathy. Thyroid normal.  Lungs:  Clear without wheezing, rales or rhonchi Cardiac: RR, without RMG Abdominal:  Soft, nontender, without masses, guarding, rebound, organomegaly or hernia Breasts:  Examined lying and sitting without masses, retractions, discharge or axillary adenopathy. Pelvic:  Ext/BUS/vagina normal  Cervix normal. Pap smear/HPV  Uterus anteverted, normal size, shape and contour, midline and mobile nontender   Adnexa  Without masses or tenderness    Anus and perineum  Normal   Rectovaginal  Normal sphincter tone without palpated masses or tenderness.    Assessment/Plan:  39 y.o. G29P0010 female for annual exam regular menses, Ortho Evra contraception.   1. Ortho Evra contraception. Patient wants to continue. Risks again reviewed to include increased risk of stroke heart attack DVT. Never smoked and not being followed for any medical issues.  Refill 1 year provided. 2. History of positive HPV on Pap smear last year with normal cytology. Negative for subtype 16, 18/45. Pap smear/HPV today. If persistent plan colposcopy. History of LGSIL with cryosurgery 2005. 3. History of small intramural myomas. Uterus normal on exam. We'll continue with annual  surveillance exams. 4. STD screening offered and declined. 5. Screening mammographic recommendations between 20 and 40 reviewed.  No strong family history of breast cancer. Patient prefers to wait closer to 40. 6. 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 Requests Valtrex prescription to have on hand just in case. Valtrex 500 mg #10 one by mouth twice a day 5 days at earliest sign of outbreak. Refill 3 7. Health maintenance. Baseline labs to include CBC, comprehensive metabolic panel, lipid profile, urinalysis ordered. Follow up in one year, sooner as needed.   [name] MD, 8:51 AM 11/22/2014

## 2014-11-23 LAB — URINALYSIS W MICROSCOPIC + REFLEX CULTURE
BACTERIA UA: NONE SEEN [HPF]
BILIRUBIN URINE: NEGATIVE
CRYSTALS: NONE SEEN [HPF]
Casts: NONE SEEN [LPF]
GLUCOSE, UA: NEGATIVE
Hgb urine dipstick: NEGATIVE
KETONES UR: NEGATIVE
LEUKOCYTES UA: NEGATIVE
Nitrite: NEGATIVE
Protein, ur: NEGATIVE
RBC / HPF: NONE SEEN RBC/HPF (ref <=2)
SPECIFIC GRAVITY, URINE: 1.022 (ref 1.001–1.035)
Yeast: NONE SEEN [HPF]
pH: 5.5 (ref 5.0–8.0)

## 2014-11-24 LAB — URINE CULTURE: Colony Count: 70000

## 2014-11-26 LAB — CYTOLOGY - PAP

## 2014-12-16 ENCOUNTER — Telehealth: Payer: Self-pay | Admitting: *Deleted

## 2014-12-16 NOTE — Telephone Encounter (Signed)
Pt called requesting lipid profile results from [facility] 11/22/14, results given

## 2015-03-16 ENCOUNTER — Ambulatory Visit (INDEPENDENT_AMBULATORY_CARE_PROVIDER_SITE_OTHER): Payer: BLUE CROSS/BLUE SHIELD | Admitting: Urgent Care

## 2015-03-16 ENCOUNTER — Encounter: Payer: Self-pay | Admitting: Urgent Care

## 2015-03-16 VITALS — BP 126/64 | HR 93 | Temp 101.1°F | Resp 12 | Ht 64.0 in | Wt 179.0 lb

## 2015-03-16 DIAGNOSIS — R0981 Nasal congestion: Secondary | ICD-10-CM | POA: Diagnosis not present

## 2015-03-16 DIAGNOSIS — J029 Acute pharyngitis, unspecified: Secondary | ICD-10-CM

## 2015-03-16 DIAGNOSIS — R6883 Chills (without fever): Secondary | ICD-10-CM

## 2015-03-16 DIAGNOSIS — R509 Fever, unspecified: Secondary | ICD-10-CM | POA: Diagnosis not present

## 2015-03-16 DIAGNOSIS — R059 Cough, unspecified: Secondary | ICD-10-CM

## 2015-03-16 DIAGNOSIS — R05 Cough: Secondary | ICD-10-CM

## 2015-03-16 DIAGNOSIS — R0602 Shortness of breath: Secondary | ICD-10-CM

## 2015-03-16 DIAGNOSIS — J329 Chronic sinusitis, unspecified: Secondary | ICD-10-CM | POA: Diagnosis not present

## 2015-03-16 DIAGNOSIS — R490 Dysphonia: Secondary | ICD-10-CM | POA: Diagnosis not present

## 2015-03-16 DIAGNOSIS — R0982 Postnasal drip: Secondary | ICD-10-CM

## 2015-03-16 MED ORDER — HYDROCODONE-HOMATROPINE 5-1.5 MG/5ML PO SYRP: 5.0000 mL | ORAL_SOLUTION | Freq: Every evening | ORAL | Status: DC | PRN

## 2015-03-16 MED ORDER — BENZONATATE 100 MG PO CAPS: 100.0000 mg | ORAL_CAPSULE | Freq: Three times a day (TID) | ORAL | Status: DC | PRN

## 2015-03-16 MED ORDER — PSEUDOEPHEDRINE HCL ER 120 MG PO TB12
120.0000 mg | ORAL_TABLET | Freq: Two times a day (BID) | ORAL | Status: DC
Start: 2015-03-16 — End: 2015-05-21

## 2015-03-16 MED ORDER — CETIRIZINE HCL 10 MG PO TABS: 10.0000 mg | ORAL_TABLET | Freq: Every day | ORAL | Status: DC

## 2015-03-16 MED ORDER — ALBUTEROL SULFATE HFA 108 (90 BASE) MCG/ACT IN AERS: 2.0000 | INHALATION_SPRAY | Freq: Four times a day (QID) | RESPIRATORY_TRACT | Status: DC | PRN

## 2015-03-16 NOTE — Progress Notes (Signed)
    MRN: [id number] DOB: 04-Sep-1975  Subjective:   Alyssa [name] is a 40 y.o. female presenting for chief complaint of Sore Throat; Cough; and Chills  Reports 4 day history of persistent and now productive cough, cough is worse at night, cough elicits chest pain and sore throat, voice hoarseness, chills. Has tried Advil and Alleve cold medication with minimal relief. Of note, she spent time with a friend in [place] who has a cat. She is not aware of any cat or seasonal allergies but admits feeling a constant tickle in her throat since then followed by her cc today. Denies fever, shob, wheezing, body aches, n/v, abdominal pain.    Desyree has a current medication list which includes the following prescription(s): norelgestromin-ethinyl estradiol. Also has No Known Allergies.  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 Also  has past surgical history that includes Hernia repair; KIDNEY STONES (2004); Foot surgery; Hysteroscopy (2010); Gynecologic cryosurgery; and Mouth surgery.  Objective:   Vitals: BP 126/64 mmHg  Pulse 93  Temp(Src) 101.1 F (38.4 C) (Oral)  Resp 12  Ht 5\' 4"  (1.626 m)  Wt 179 lb (81.194 kg)  BMI 30.71 kg/m2  SpO2 95%  LMP 02/23/2015  Physical Exam  Constitutional: She is oriented to person, place, and time. She appears well-developed and well-nourished.  HENT:  TM's intact bilaterally, no effusions or erythema. Nasal turbinates with slight erythema. Mild bilateral sinus tenderness. No oropharyngeal exudates, erythema or abscesses.  Eyes: Right eye exhibits no discharge. Left eye exhibits no discharge. No scleral icterus.  Neck: Normal range of motion. Neck supple.  Cardiovascular: Normal rate, regular rhythm and intact distal pulses.  Exam reveals no gallop and no friction rub.   No  murmur heard. Pulmonary/Chest: No respiratory distress. She has no wheezes. She has no rales.  Lymphadenopathy:    She has no cervical adenopathy.  Neurological: She is alert and oriented to person, place, and time.  Skin: Skin is warm and dry.   Assessment and Plan :   1. Cough 2. Hoarseness 3. Nasal congestion 4. Fever, unspecified 5. Chills 6. Sore throat 7. Post-nasal drainage 8. Shortness of breath - Patient declined flu testing, refused to believe that it is a possibility. She agreed to symptomatic relief for upper respiratory infection versus allergic rhinitis. I offered script for albuterol in case her shob does not improve with supportive measures. Patient is to rtc in 1 week if not better.  Jaynee Eagles, PA-C [facility] [phone] 03/16/2015 12:41 PM

## 2015-03-16 NOTE — Patient Instructions (Signed)

## 2015-05-21 ENCOUNTER — Encounter: Payer: Self-pay | Admitting: Gynecology

## 2015-05-21 ENCOUNTER — Ambulatory Visit (INDEPENDENT_AMBULATORY_CARE_PROVIDER_SITE_OTHER): Payer: BLUE CROSS/BLUE SHIELD | Admitting: Gynecology

## 2015-05-21 VITALS — BP 118/76

## 2015-05-21 DIAGNOSIS — R102 Pelvic and perineal pain: Secondary | ICD-10-CM

## 2015-05-21 LAB — URINALYSIS W MICROSCOPIC + REFLEX CULTURE
Bilirubin Urine: NEGATIVE
CRYSTALS: NONE SEEN [HPF]
Casts: NONE SEEN [LPF]
GLUCOSE, UA: NEGATIVE
LEUKOCYTES UA: NEGATIVE
Nitrite: POSITIVE — AB
Specific Gravity, Urine: 1.015 (ref 1.001–1.035)
YEAST: NONE SEEN [HPF]
pH: 6 (ref 5.0–8.0)

## 2015-05-21 MED ORDER — NITROFURANTOIN MONOHYD MACRO 100 MG PO CAPS: 100.0000 mg | ORAL_CAPSULE | Freq: Two times a day (BID) | ORAL | Status: DC

## 2015-05-21 MED ORDER — FLUCONAZOLE 150 MG PO TABS: 150.0000 mg | ORAL_TABLET | Freq: Once | ORAL | Status: DC

## 2015-05-21 NOTE — Patient Instructions (Signed)
Follow up for ultrasound as scheduled.  Start the antibiotics twice daily for 7 days.

## 2015-05-21 NOTE — Progress Notes (Signed)
    Alyssa [name] 1975-08-26 [id number]        40 y.o.  [name] Presents with several days of lower pelvic pressure and pain. Also some urinary frequency. Just started her menses yesterday. No fever or chills. No nausea vomiting diarrhea constipation. No urgency or dysuria. Does have a history of kidney stones. Does have a history of leiomyoma.  Past medical history,surgical history, problem list, medications, allergies, family history and social history were all reviewed and documented in the EPIC chart.  Directed ROS with pertinent positives and negatives documented in the history of present illness/assessment and plan.  Exam: [name] assistant Filed Vitals:   05/21/15 0922  BP: 118/76   General appearance:  Normal without acute distress Spine straight without CVA tenderness Abdomen soft nontender without masses guarding rebound Pelvic external BUS vagina with menses-like flow. Cervix normal. Uterus generous midline mobile nontender. Adnexa without masses or tenderness  Assessment/Plan:  40 y.o. [name] with above history and exam. Urinalysis contaminated from menses but does show 10-20 WBCs and many bacteria. Will cover as UTI with Macrobid 100 mg twice a day 7 days. Diflucan 150 mg 1 provided that she notes frequent yeast infections with antibiotics. Schedule GYN ultrasound to rule out ovarian process as source of pain. Consider referral to urology with history of kidney stones if pain continues despite antibiotics and the ultrasound is negative.    [name] MD, 9:40 AM 05/21/2015

## 2015-05-21 NOTE — Addendum Note (Signed)
Addended by: Nelva Nay on: 05/21/2015 10:37 AM   Modules accepted: Orders

## 2015-05-23 LAB — URINE CULTURE
Colony Count: NO GROWTH
ORGANISM ID, BACTERIA: NO GROWTH

## 2015-05-26 ENCOUNTER — Telehealth: Payer: Self-pay | Admitting: *Deleted

## 2015-05-26 ENCOUNTER — Ambulatory Visit (INDEPENDENT_AMBULATORY_CARE_PROVIDER_SITE_OTHER): Payer: BLUE CROSS/BLUE SHIELD

## 2015-05-26 ENCOUNTER — Other Ambulatory Visit: Payer: Self-pay | Admitting: Gynecology

## 2015-05-26 ENCOUNTER — Encounter: Payer: Self-pay | Admitting: Gynecology

## 2015-05-26 ENCOUNTER — Ambulatory Visit (INDEPENDENT_AMBULATORY_CARE_PROVIDER_SITE_OTHER): Payer: BLUE CROSS/BLUE SHIELD | Admitting: Gynecology

## 2015-05-26 VITALS — BP 122/76

## 2015-05-26 DIAGNOSIS — R188 Other ascites: Secondary | ICD-10-CM

## 2015-05-26 DIAGNOSIS — N949 Unspecified condition associated with female genital organs and menstrual cycle: Secondary | ICD-10-CM | POA: Diagnosis not present

## 2015-05-26 DIAGNOSIS — N83202 Unspecified ovarian cyst, left side: Secondary | ICD-10-CM | POA: Diagnosis not present

## 2015-05-26 DIAGNOSIS — R102 Pelvic and perineal pain unspecified side: Secondary | ICD-10-CM

## 2015-05-26 DIAGNOSIS — N9489 Other specified conditions associated with female genital organs and menstrual cycle: Secondary | ICD-10-CM

## 2015-05-26 NOTE — Telephone Encounter (Signed)
Notes faxed to [facility] they will fax me back with time and date.

## 2015-05-26 NOTE — Patient Instructions (Signed)
Office will call to arrange for the urology appointment and follow up ultrasound appointment. Call my office if you do not hear about these 2 appointments in one week.

## 2015-05-26 NOTE — Progress Notes (Signed)
    Alyssa [name] Alyssa [name] [birth year] [id number]        40 y.o.  [name] Presents for ultrasound. Has relatively acute onset of pelvic discomfort and pressure approximate one half weeks ago. No nausea vomiting diarrhea constipation. Little bit of frequency. Was started on antibiotics for presumed UTI 5 days ago but says she does not feel any better. She's also had a menses last week but again did not change her discomfort. Also has a history of kidney stones in the past.  Past medical history,surgical history, problem list, medications, allergies, family history and social history were all reviewed and documented in the EPIC chart.  Directed ROS with pertinent positives and negatives documented in the history of present illness/assessment and plan.  Exam: Filed Vitals:   05/26/15 0836  BP: 122/76   General appearance:  Normal in no acute distress  Ultrasound shows uterus grossly normal in size with several small myomasMeasuring 37 mm, 36 mm, 18 mm. Endometrial echo of 2.9 mm. Right ovary grossly normal. Left ovary with thick walled cyst 18 x 12 x 16 mm negative color flow. [place] tubular cystic/solid area 35 x 29 x 26 mm surrounding the ovary. Negative color flow. Cul-de-sac with 41 x 35 x 40 mm of fluid.  Assessment/Plan:  40 y.o. [name] with pelvic pain 1-1/2 weeks described primarily as pressure. Finishing her antibiotics for presumed UTI but does not appear to feel better. Ultrasound shows questional small hydrosalpinx/other cystic area involving the left adnexa. Small amount of fluid in the cul-de-sac. Differential reviewed with the patient to include GYN versus non-GYN. Unclear that a small area on the left causing this discomfort although possible. Reviewed with the patient my next option is to proceed with laparoscopy. I reviewed which involved with this to include her prior history of umbilical hernia repair 2 and potential for scarring and increased risk for injury. Recommend patient  follow up with urology now just to rule that out as a source given her history of renal lithiasis and then if her pain continues proceed with laparoscopy. If her pain resolves need to repeat her ultrasound regardless in 1-2 months discussed to relook at this area in the left which may necessitate surgery regardless. Patient understands the importance of follow up and go ahead and schedule the ultrasound and urology appointment.  Greater than 50% of my 15 minute visit was spent in direct face to face counseling and coordination of care with the patient.  [name] MD, 9:05 AM 05/26/2015

## 2015-05-26 NOTE — Telephone Encounter (Signed)
-----   Message from [name], MD sent at 05/26/2015  9:09 AM EDT ----- Schedule:  #1 urology appointment reference pelvic pain history of renal lithiasis  #2 follow up GYN ultrasound here one to 2 months from now.

## 2015-06-02 NOTE — Telephone Encounter (Signed)
Pt declined to see any MD at this office location, states she was informed that she owes money. Pt said she is going to find another urology group in her network. I told her to let me know is she needs me to schedule appointment for her.

## 2015-06-05 NOTE — Telephone Encounter (Signed)
Late entry (06/02/15) pt also declined to schedule GYN ultrasound in 2 months as well.

## 2015-09-08 ENCOUNTER — Telehealth: Payer: Self-pay | Admitting: *Deleted

## 2015-09-08 NOTE — Telephone Encounter (Signed)
Pt called c/o spotting on Xulane birth control patch, cycle due to start on Wednesday this week, had some dark blood on Saturday, no pain, light cramping, no cramping now, has fibroids, asked if normal to have breakthrough bleeding, I advised pt no abnormal could be early cycle, no sexual active. Will watch for now and follow up after cycle if continues.

## 2015-11-28 ENCOUNTER — Encounter: Payer: Self-pay | Admitting: Gynecology

## 2015-11-28 ENCOUNTER — Ambulatory Visit (INDEPENDENT_AMBULATORY_CARE_PROVIDER_SITE_OTHER): Payer: BLUE CROSS/BLUE SHIELD | Admitting: Gynecology

## 2015-11-28 VITALS — BP 136/84 | Ht 64.0 in | Wt 180.0 lb

## 2015-11-28 DIAGNOSIS — A609 Anogenital herpesviral infection, unspecified: Secondary | ICD-10-CM | POA: Diagnosis not present

## 2015-11-28 DIAGNOSIS — D251 Intramural leiomyoma of uterus: Secondary | ICD-10-CM | POA: Diagnosis not present

## 2015-11-28 DIAGNOSIS — Z01419 Encounter for gynecological examination (general) (routine) without abnormal findings: Secondary | ICD-10-CM

## 2015-11-28 DIAGNOSIS — R87810 Cervical high risk human papillomavirus (HPV) DNA test positive: Secondary | ICD-10-CM

## 2015-11-28 DIAGNOSIS — N7011 Chronic salpingitis: Secondary | ICD-10-CM

## 2015-11-28 LAB — CBC WITH DIFFERENTIAL/PLATELET
BASOS PCT: 0 %
Basophils Absolute: 0 cells/uL (ref 0–200)
EOS ABS: 104 {cells}/uL (ref 15–500)
Eosinophils Relative: 2 %
HCT: 38.4 % (ref 35.0–45.0)
Hemoglobin: 12.7 g/dL (ref 11.7–15.5)
LYMPHS PCT: 44 %
Lymphs Abs: 2288 cells/uL (ref 850–3900)
MCH: 29.3 pg (ref 27.0–33.0)
MCHC: 33.1 g/dL (ref 32.0–36.0)
MCV: 88.5 fL (ref 80.0–100.0)
MONO ABS: 416 {cells}/uL (ref 200–950)
MONOS PCT: 8 %
MPV: 9.9 fL (ref 7.5–12.5)
NEUTROS ABS: 2392 {cells}/uL (ref 1500–7800)
Neutrophils Relative %: 46 %
PLATELETS: 358 10*3/uL (ref 140–400)
RBC: 4.34 MIL/uL (ref 3.80–5.10)
RDW: 13.8 % (ref 11.0–15.0)
WBC: 5.2 10*3/uL (ref 3.8–10.8)

## 2015-11-28 LAB — COMPREHENSIVE METABOLIC PANEL
ALK PHOS: 26 U/L — AB (ref 33–115)
ALT: 10 U/L (ref 6–29)
AST: 15 U/L (ref 10–30)
Albumin: 3.8 g/dL (ref 3.6–5.1)
BILIRUBIN TOTAL: 0.4 mg/dL (ref 0.2–1.2)
BUN: 7 mg/dL (ref 7–25)
CO2: 18 mmol/L — ABNORMAL LOW (ref 20–31)
CREATININE: 0.75 mg/dL (ref 0.50–1.10)
Calcium: 8.8 mg/dL (ref 8.6–10.2)
Chloride: 103 mmol/L (ref 98–110)
GLUCOSE: 81 mg/dL (ref 65–99)
Potassium: 4.3 mmol/L (ref 3.5–5.3)
Sodium: 137 mmol/L (ref 135–146)
TOTAL PROTEIN: 6.9 g/dL (ref 6.1–8.1)

## 2015-11-28 MED ORDER — NORELGESTROMIN-ETH ESTRADIOL 150-35 MCG/24HR TD PTWK: MEDICATED_PATCH | TRANSDERMAL | 4 refills | Status: DC

## 2015-11-28 NOTE — Progress Notes (Signed)
    Alyssa [name] 02-08-1975 [id number]        40 y.o.  [name]  for annual exam.  Several issues noted below.  Past medical history,surgical history, problem list, medications, allergies, family history and social history were all reviewed and documented as reviewed in the EPIC chart.  ROS:  Performed with pertinent positives and negatives included in the history, assessment and plan.   Additional significant findings :  None   Exam: [name] assistant Vitals:   11/28/15 1027  BP: 136/84  Weight: 180 lb (81.6 kg)  Height: 5\' 4"  (1.626 m)   Body mass index is 30.9 kg/m.  General appearance:  Normal affect, orientation and appearance. Skin: Grossly normal HEENT: Without gross lesions.  No cervical or supraclavicular adenopathy. Thyroid normal.  Lungs:  Clear without wheezing, rales or rhonchi Cardiac: RR, without RMG Abdominal:  Soft, nontender, without masses, guarding, rebound, organomegaly or hernia Breasts:  Examined lying and sitting without masses, retractions, discharge or axillary adenopathy. Pelvic:  Ext, BUS, Vagina normal  Cervix normal. Pap smear/HPV  Uterus anteverted, normal size, shape and contour, midline and mobile nontender   Adnexa without masses or tenderness    Anus and perineum normal   Rectovaginal normal sphincter tone without palpated masses or tenderness.    Assessment/Plan:  40 y.o. G63P0010 female for annual exam with regular menses, Ortho Evra for contraception.   1. Contraception. Patient currently not sexually active but once to continue on Ortho Evra for menstrual regulation and if she does become sexually active. Does not smoke and otherwise healthy. Refill 1 year provided. 2. History of pelvic pain with ultrasound in May showing a serpentine tubular cystic area 35 x 29 x 26 mm surrounding the left ovary. Questionable hydrosalpinx. Was recommended for follow up ultrasound in 1-2 months but did not follow up for this. Recommend follow up  ultrasound now patient agrees to schedule follow up for this. The pain that she was having previously has totally resolved. 3. Pap smear 2015 with positive HPV normal cytology. Negative subtype 16, 18/45. Follow up Pap smear/HPV 2016 negative. Pap smear with HPV done today. History of cryosurgery 2005. 4. Breast health. SBE monthly reviewed. Screening mammographic recommendations reviewed and she plans to start at age 20. 68. History of HSV. Has not had an outbreak for years. Had used Valtrex in the past intermittently with outbreak. Will call if she needs refill. 6. History of small myomas. Uterus normal on exam. We'll check with ultrasound as above. 7. Health maintenance. Baseline CBC, CMP, urinalysis ordered. Lipid profile last year was excellent. Follow up for ultrasound otherwise follow up in one year for annual exam.   [name] MD, 10:49 AM 11/28/2015

## 2015-11-28 NOTE — Patient Instructions (Signed)
Follow up for ultrasound as scheduled 

## 2015-11-28 NOTE — Addendum Note (Signed)
Addended by: Nelva Nay on: 11/28/2015 11:14 AM   Modules accepted: Orders

## 2015-11-29 LAB — URINALYSIS W MICROSCOPIC + REFLEX CULTURE
BACTERIA UA: NONE SEEN [HPF]
BILIRUBIN URINE: NEGATIVE
CASTS: NONE SEEN [LPF]
Glucose, UA: NEGATIVE
Hgb urine dipstick: NEGATIVE
Ketones, ur: NEGATIVE
Leukocytes, UA: NEGATIVE
Nitrite: NEGATIVE
Specific Gravity, Urine: 1.02 (ref 1.001–1.035)
YEAST: NONE SEEN [HPF]
pH: 7 (ref 5.0–8.0)

## 2015-11-30 LAB — URINE CULTURE: ORGANISM ID, BACTERIA: NO GROWTH

## 2015-12-03 LAB — PAP IG AND HPV HIGH-RISK: HPV DNA High Risk: NOT DETECTED

## 2015-12-08 ENCOUNTER — Ambulatory Visit: Payer: BLUE CROSS/BLUE SHIELD | Admitting: Gynecology

## 2015-12-08 ENCOUNTER — Other Ambulatory Visit: Payer: BLUE CROSS/BLUE SHIELD

## 2015-12-22 ENCOUNTER — Other Ambulatory Visit: Payer: Self-pay | Admitting: Gynecology

## 2015-12-22 ENCOUNTER — Encounter: Payer: Self-pay | Admitting: Gynecology

## 2015-12-22 ENCOUNTER — Ambulatory Visit (INDEPENDENT_AMBULATORY_CARE_PROVIDER_SITE_OTHER): Payer: BLUE CROSS/BLUE SHIELD

## 2015-12-22 ENCOUNTER — Ambulatory Visit (INDEPENDENT_AMBULATORY_CARE_PROVIDER_SITE_OTHER): Payer: BLUE CROSS/BLUE SHIELD | Admitting: Gynecology

## 2015-12-22 VITALS — BP 116/76

## 2015-12-22 DIAGNOSIS — D251 Intramural leiomyoma of uterus: Secondary | ICD-10-CM | POA: Diagnosis not present

## 2015-12-22 DIAGNOSIS — R102 Pelvic and perineal pain: Secondary | ICD-10-CM | POA: Diagnosis not present

## 2015-12-22 DIAGNOSIS — D252 Subserosal leiomyoma of uterus: Secondary | ICD-10-CM

## 2015-12-22 DIAGNOSIS — N7011 Chronic salpingitis: Secondary | ICD-10-CM

## 2015-12-22 NOTE — Progress Notes (Signed)
    Alyssa [name] [facility] [birth year] [id number]        40 y.o.  [name] presents for ultrasound. History of some pelvic pain particular with the onset of her menses noted. Had area of questionable hydrosalpinx on her left with leiomyoma at her previous ultrasound. Recommended follow up ultrasound to relook at this area.  Past medical history,surgical history, problem list, medications, allergies, family history and social history were all reviewed and documented in the EPIC chart.  Directed ROS with pertinent positives and negatives documented in the history of present illness/assessment and plan.  Exam: Vitals:   12/22/15 0908  BP: 116/76   General appearance:  Normal  Ultrasound transvaginal shows uterus overall normal size with multiple small myomas largest measuring 43 mm, 44 mm. No significant change from prior ultrasound.  Endometrial echo 1.9 mm. Right and left ovaries normal. Previous tubular area left adnexa no longer seen. Fluid in the cul-de-sac noted 50 x 22 x 39 mm.  Assessment/Plan:  40 y.o. [name] with resolution of her prior tubular structure in left adnexa. Multiple small myomas stable overall. Does have a history of hysteroscopic myomectomy in the past. Some pelvic discomfort at the beginning of her menses but having no other issues. Will plan monitoring for now. Follow up  1 year when due for her annual exam, sooner if any issues.    [name] MD, 9:27 AM 12/22/2015

## 2015-12-22 NOTE — Patient Instructions (Signed)
Followup in one year for annual exam, sooner if any issues 

## 2016-04-21 ENCOUNTER — Other Ambulatory Visit: Payer: Self-pay | Admitting: *Deleted

## 2016-04-21 MED ORDER — NORELGESTROMIN-ETH ESTRADIOL 150-35 MCG/24HR TD PTWK: MEDICATED_PATCH | TRANSDERMAL | 2 refills | Status: DC

## 2016-05-24 ENCOUNTER — Ambulatory Visit (INDEPENDENT_AMBULATORY_CARE_PROVIDER_SITE_OTHER): Payer: BLUE CROSS/BLUE SHIELD | Admitting: Gynecology

## 2016-05-24 ENCOUNTER — Encounter: Payer: Self-pay | Admitting: Gynecology

## 2016-05-24 VITALS — BP 118/76

## 2016-05-24 DIAGNOSIS — N643 Galactorrhea not associated with childbirth: Secondary | ICD-10-CM

## 2016-05-24 LAB — TSH: TSH: 2.08 m[IU]/L

## 2016-05-24 NOTE — Patient Instructions (Signed)
Follow-up for blood test results. 

## 2016-05-24 NOTE — Progress Notes (Signed)
    Alyssa [name] [birth year] [id number]        41 y.o.  [name] presents having noticed clear to yellowish nipple discharge from her right breast over the past several months. Never bloody. No masses or tenderness on self breast exam.  Past medical history,surgical history, problem list, medications, allergies, family history and social history were all reviewed and documented in the EPIC chart.  Directed ROS with pertinent positives and negatives documented in the history of present illness/assessment and plan.  Exam: [name] assistant Vitals:   05/24/16 1107  BP: 118/76   General appearance:  Normal Both breast examined lying and sitting without masses, retractions, adenopathy. Clear scant galactorrhea right breast with squeezing. None elicited on the left  Assessment/Plan:  41 y.o. [name] with clear scant galactorrhea right nipple. Exam otherwise normal. Check baseline TSH and prolactin. Discussed the issues of galactorrhea with patient. As long as never bloody will monitor for now. If prolactin elevated I discussed the possibility of a prolactinoma and need for follow up MRI.    [name] MD, 11:33 AM 05/24/2016

## 2016-05-25 ENCOUNTER — Telehealth: Payer: Self-pay

## 2016-05-25 LAB — PROLACTIN: Prolactin: 7.6 ng/mL

## 2016-05-25 NOTE — Telephone Encounter (Signed)
Patient informed. 

## 2016-05-25 NOTE — Telephone Encounter (Addendum)
Patient was informed about lab results: "Tell patient that her blood results were totally normal. I would watch breast leakage for now. Really not a whole lot else you can do."  Patient said this is not normal and she wants to know why this is happening.  Patient called back and said she wanted Dr. Loetta Rough to call her about this.

## 2016-05-25 NOTE — Telephone Encounter (Signed)
We talked about this yesterday in great detail. I do not have an explanation as to why this happens in some women and not other women. She should avoid stimulating the breasts and wear a well supporting bra.  A normal prolactin level tells me it is not due to a tumor and that taking medicine to try to suppress a low prolactin level probably would not help the galactorrhea.  I really have nothing else to add or tell her and I'm sorry the answer is not definitive.  Sometimes certain medications can stimulate leakage but she is not on any of those types of medication. It would be unlikely that her patch is the source. The only option there would be to stop the patch and use alternative birth control, nonhormonal to see if the leakage does not stop. If it does stop unfortunately it may be coincidental and not related to stopping the patch.

## 2016-06-02 ENCOUNTER — Encounter: Payer: Self-pay | Admitting: Gynecology

## 2016-10-26 DIAGNOSIS — M2669 Other specified disorders of temporomandibular joint: Secondary | ICD-10-CM | POA: Insufficient documentation

## 2016-11-29 ENCOUNTER — Ambulatory Visit (INDEPENDENT_AMBULATORY_CARE_PROVIDER_SITE_OTHER): Payer: BLUE CROSS/BLUE SHIELD | Admitting: Gynecology

## 2016-11-29 ENCOUNTER — Encounter: Payer: Self-pay | Admitting: Gynecology

## 2016-11-29 VITALS — BP 136/84 | Ht 63.0 in | Wt 180.0 lb

## 2016-11-29 DIAGNOSIS — Z113 Encounter for screening for infections with a predominantly sexual mode of transmission: Secondary | ICD-10-CM | POA: Diagnosis not present

## 2016-11-29 DIAGNOSIS — Z01419 Encounter for gynecological examination (general) (routine) without abnormal findings: Secondary | ICD-10-CM | POA: Diagnosis not present

## 2016-11-29 DIAGNOSIS — N643 Galactorrhea not associated with childbirth: Secondary | ICD-10-CM

## 2016-11-29 DIAGNOSIS — D259 Leiomyoma of uterus, unspecified: Secondary | ICD-10-CM

## 2016-11-29 LAB — CBC WITH DIFFERENTIAL/PLATELET
BASOS ABS: 10 {cells}/uL (ref 0–200)
Basophils Relative: 0.2 %
EOS PCT: 1.2 %
Eosinophils Absolute: 61 cells/uL (ref 15–500)
HCT: 36.2 % (ref 35.0–45.0)
HEMOGLOBIN: 12.1 g/dL (ref 11.7–15.5)
Lymphs Abs: 2259 cells/uL (ref 850–3900)
MCH: 28.3 pg (ref 27.0–33.0)
MCHC: 33.4 g/dL (ref 32.0–36.0)
MCV: 84.8 fL (ref 80.0–100.0)
MONOS PCT: 6.3 %
MPV: 11 fL (ref 7.5–12.5)
NEUTROS ABS: 2448 {cells}/uL (ref 1500–7800)
Neutrophils Relative %: 48 %
Platelets: 374 10*3/uL (ref 140–400)
RBC: 4.27 10*6/uL (ref 3.80–5.10)
RDW: 13 % (ref 11.0–15.0)
Total Lymphocyte: 44.3 %
WBC mixed population: 321 cells/uL (ref 200–950)
WBC: 5.1 10*3/uL (ref 3.8–10.8)

## 2016-11-29 MED ORDER — VALACYCLOVIR HCL 500 MG PO TABS: 500.0000 mg | ORAL_TABLET | Freq: Two times a day (BID) | ORAL | 0 refills | Status: DC

## 2016-11-29 MED ORDER — NORELGESTROMIN-ETH ESTRADIOL 150-35 MCG/24HR TD PTWK: MEDICATED_PATCH | TRANSDERMAL | 4 refills | Status: DC

## 2016-11-29 NOTE — Progress Notes (Addendum)
Alyssa [name] [birth year] [id number]        41 y.o.  [name] for annual gynecologic exam.  Several issues noted below.  Past medical history,surgical history, problem list, medications, allergies, family history and social history were all reviewed and documented as reviewed in the EPIC chart.  ROS:  Performed with pertinent positives and negatives included in the history, assessment and plan.   Additional significant findings : None   Exam: Alyssa [name] blood assistant Vitals:   11/Alyssa/18 1120  BP: 136/84  Weight: 180 lb (81.6 kg)  Height: 5\' 3"  (1.6 m)   Body mass index is 31.89 kg/m.  General appearance:  Normal affect, orientation and appearance. Skin: Grossly normal HEENT: Without gross lesions.  No cervical or supraclavicular adenopathy. Thyroid normal.  Lungs:  Clear without wheezing, rales or rhonchi Cardiac: RR, without RMG Abdominal:  Soft, nontender, without masses, guarding, rebound, organomegaly or hernia Breasts:  Examined lying and sitting without masses, retractions, discharge or axillary adenopathy.  No galactorrhea noted in either breast. Pelvic:  Ext, BUS, Vagina: Normal  Cervix: Normal  Uterus: Anteverted, normal size, shape and contour, midline and mobile nontender   Adnexa: Without masses or tenderness    Anus and perineum: Normal   Rectovaginal: Normal sphincter tone without palpated masses or tenderness.    Assessment/Plan:  Alyssa [name] female for annual gynecologic exam with regular menses, Ortho Evra contraception.   1. Galactorrhea.  Patient reports right-sided galactorrhea continues as noted on previous exam.  Prior prolactin level 7.  Is yellow to whitish in color never bloody.  No palpable abnormalities on self breast exam.  Physician exam is normal with no evidence of galactorrhea today.  Recheck prolactin level today.  Discussed the etiology of galactorrhea.  Options to stop her Ortho Evra to see if this does not help resolve the issue  discussed.  She is not using it for contraception but for menstrual regulation.  At this point patient desires to continue Ortho Evra and will tolerate the intermittent galactorrhea.  Discussed whether to do screening mammogram now or wait till age 89 at her discretion.  Patient prefers to wait.  She knows to call though if discharge is ever bloody. 2. History of leiomyoma.  Exam continues to show uterus grossly normal on bimanual.  Menses acceptable.  Will continue to follow with annual exams. 3. Pap smear with positive HPV 2015.  Normal cytology.  Negative subtypes 16, 18/45.  Follow-up Pap smear with HPV 2016 and 2017 negative.  No Pap smear done today.  Will plan repeat Pap smear in 3-5-year interval per current screening guidelines. 4. STD screening requested by the patient for GC/chlamydia which was done of the cervix.  Discussed serum screening to include hepatitis B and C as well as RPR and HIV.  Patient declines and does not want serum screening. 5. History of HSV.  Has not had an outbreak in years.  Requests a new prescription for Valtrex to have in case she has an outbreak.  Valtrex 500 mg 1 p.o. twice daily times 5 days with outbreak #30 with no refills provided. 6. Ortho Evra.  Continuing for menstrual regulation.  Options to stop as discussed above reviewed but patient prefers to continue for now.  Refill times 1 year provided. 7. Health maintenance.  Baseline CBC, CMP, urine analysis ordered along with her prolactin, GC and chlamydia.  Lipid profile normal 2 years ago noting cholesterol 138 and LDL 77.  Not repeated this year.  Follow-up in 1 year, sooner as needed.   [name] MD, Alyssa:14 PM 11/Alyssa/2018

## 2016-11-29 NOTE — Patient Instructions (Signed)
Follow-up in 1 year, sooner as needed.  Call if the leakage from the breast is ever bloody or brownish.

## 2016-11-30 LAB — URINALYSIS W MICROSCOPIC + REFLEX CULTURE
Bilirubin Urine: NEGATIVE
GLUCOSE, UA: NEGATIVE
HGB URINE DIPSTICK: NEGATIVE
HYALINE CAST: NONE SEEN /LPF
Leukocyte Esterase: NEGATIVE
Nitrites, Initial: NEGATIVE
Specific Gravity, Urine: 1.023 (ref 1.001–1.03)
pH: 5.5 (ref 5.0–8.0)

## 2016-11-30 LAB — COMPREHENSIVE METABOLIC PANEL
AG RATIO: 1.1 (calc) (ref 1.0–2.5)
ALT: 8 U/L (ref 6–29)
AST: 11 U/L (ref 10–30)
Albumin: 3.5 g/dL — ABNORMAL LOW (ref 3.6–5.1)
Alkaline phosphatase (APISO): 32 U/L — ABNORMAL LOW (ref 33–115)
BUN: 10 mg/dL (ref 7–25)
CO2: 28 mmol/L (ref 20–32)
Calcium: 8.7 mg/dL (ref 8.6–10.2)
Chloride: 104 mmol/L (ref 98–110)
Creat: 0.96 mg/dL (ref 0.50–1.10)
GLUCOSE: 94 mg/dL (ref 65–99)
Globulin: 3.2 g/dL (calc) (ref 1.9–3.7)
Potassium: 4.1 mmol/L (ref 3.5–5.3)
SODIUM: 138 mmol/L (ref 135–146)
TOTAL PROTEIN: 6.7 g/dL (ref 6.1–8.1)
Total Bilirubin: 0.3 mg/dL (ref 0.2–1.2)

## 2016-11-30 LAB — PROLACTIN: PROLACTIN: 7.6 ng/mL

## 2016-11-30 LAB — C. TRACHOMATIS/N. GONORRHOEAE RNA
C. trachomatis RNA, TMA: NOT DETECTED
N. gonorrhoeae RNA, TMA: NOT DETECTED

## 2016-11-30 LAB — NO CULTURE INDICATED

## 2016-12-02 ENCOUNTER — Encounter: Payer: BLUE CROSS/BLUE SHIELD | Admitting: Gynecology

## 2017-01-23 ENCOUNTER — Other Ambulatory Visit: Payer: Self-pay | Admitting: Gynecology

## 2017-03-02 ENCOUNTER — Telehealth: Payer: Self-pay | Admitting: *Deleted

## 2017-03-02 MED ORDER — NORELGESTROMIN-ETH ESTRADIOL 150-35 MCG/24HR TD PTWK: MEDICATED_PATCH | TRANSDERMAL | 3 refills | Status: DC

## 2017-03-02 NOTE — Telephone Encounter (Signed)
Pt called requesting Xulane 150-35 mcg patch send to mail order pharmacy. Rx sent pt aware.

## 2017-06-21 ENCOUNTER — Telehealth: Payer: Self-pay | Admitting: *Deleted

## 2017-06-21 MED ORDER — VALACYCLOVIR HCL 500 MG PO TABS: 500.0000 mg | ORAL_TABLET | Freq: Every day | ORAL | 1 refills | Status: DC

## 2017-06-21 NOTE — Telephone Encounter (Signed)
Valtrex 500 mg daily #30 with 6 refills okay

## 2017-06-21 NOTE — Telephone Encounter (Signed)
Patient called rare HSV outbreaks has Rx for Valtrex 500 mg tablets to take PRN. She is now dating someone and would like to take Rx daily, needs a new Rx stating 1 po daily. Okay to send?

## 2017-06-21 NOTE — Telephone Encounter (Signed)
patient aware, Rx sent to mail order pharmacy.

## 2017-06-29 ENCOUNTER — Telehealth: Payer: Self-pay | Admitting: *Deleted

## 2017-06-29 NOTE — Telephone Encounter (Signed)
Low risk but possible.  Transmission in patients using daily Valtrex has occurred in asymptomatic patients but risk is lower using Valtrex then not using Valtrex.

## 2017-06-29 NOTE — Telephone Encounter (Signed)
Pt informed

## 2017-06-29 NOTE — Telephone Encounter (Signed)
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 Please advise

## 2017-07-22 ENCOUNTER — Other Ambulatory Visit: Payer: Self-pay

## 2017-07-22 ENCOUNTER — Encounter (HOSPITAL_COMMUNITY): Payer: Self-pay | Admitting: Emergency Medicine

## 2017-07-22 ENCOUNTER — Emergency Department (HOSPITAL_COMMUNITY)
Admission: EM | Admit: 2017-07-22 | Discharge: 2017-07-23 | Disposition: A | Discharge status: Home / Self Care | Payer: BLUE CROSS/BLUE SHIELD | Attending: Emergency Medicine | Admitting: Emergency Medicine

## 2017-07-22 DIAGNOSIS — Z79899 Other long term (current) drug therapy: Secondary | ICD-10-CM | POA: Diagnosis not present

## 2017-07-22 DIAGNOSIS — T148XXA Other injury of unspecified body region, initial encounter: Secondary | ICD-10-CM

## 2017-07-22 DIAGNOSIS — L989 Disorder of the skin and subcutaneous tissue, unspecified: Secondary | ICD-10-CM | POA: Insufficient documentation

## 2017-07-22 NOTE — ED Triage Notes (Signed)
Pt reports mole on L pelvic area was pulled off while having waxing done today and continues to bleed.

## 2017-07-23 NOTE — ED Notes (Signed)
Pt states she has high BP and it is not new that her BP is high today and she does not taker any medication for it. Pt states she will follow up with primary about her high BP.

## 2017-07-23 NOTE — ED Provider Notes (Signed)
[facility] EMERGENCY DEPARTMENT Provider Note   CSN: [id number] Arrival date & time: 07/22/17  2124     History   Chief Complaint Chief Complaint  Patient presents with  . mole bleeding    HPI Alyssa [name] is a 42 y.o. female with a hx of HPV, HSV, uterine fibroids presents to the Emergency Department complaining of acute, persistent bleeding from a mole on her mons pubis which started around 8 AM after waxing.  Patient reports after the waxing was completed, she was informed that there was a little bit of bleeding.  She reports that later in the day she noticed some blood in her underwear.  When she checked the mole, she noticed that there was an open wound at the base with some persistent oozing.  Patient is not taking any blood thinners.  She does not have HIV or diabetes to increase her risk of infection.  She has used a Band-Aid with hemostasis.  She reports that palpation of the site increases the pain and bleeding.  She denies fever, chills, nausea, vomiting, redness or purulent drainage.   The history is provided by the patient and medical records. No language interpreter was used.    Past Medical History:  Diagnosis Date  . CIN I (cervical intraepithelial neoplasia I) 02/2003   CRYO  . Fibroid    MYOMA  . 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  repeat pap smear in one year  . HSV infection   . STD (sexually transmitted disease) 08/2004   POSITIVE GC-HSVll    Patient Active Problem List   Diagnosis Date Noted  . Fibroid   . HSV infection   . STD (sexually transmitted disease) 08/18/2004  . CIN I (cervical intraepithelial neoplasia I) 02/19/2003    Past Surgical History:  Procedure Laterality Date  . FOOT SURGERY    . GYNECOLOGIC CRYOSURGERY    . HERNIA REPAIR     X2  . HYSTEROSCOPY  2010   HYSTEROSCOPIC MYOMECTOMY  . KIDNEY STONES  2004    . MOUTH SURGERY       OB History    Gravida  1   Para      Term      Preterm      AB  1   Living  0     SAB      TAB      Ectopic      Multiple      Live Births               Home Medications    Prior to Admission medications   Medication Sig Start Date End Date Taking? Authorizing Provider  norelgestromin-ethinyl estradiol [name]) 150-35 MCG/24HR transdermal patch APPLY 1 PATCH ON SKIN ONCE WEEKLY 03/02/17   Fontaine, Belinda Block, MD  valACYclovir (VALTREX) 500 MG tablet Take 1 tablet (500 mg total) by mouth daily. 06/21/17   Fontaine, Belinda Block, MD    Family History Family History  Problem Relation Age of Onset  . Diabetes Mother   . Hypertension Mother   . Kidney failure Mother   . Breast cancer Maternal Aunt        Age 34's  . Diabetes Maternal Grandmother   . Hypertension Maternal Grandmother     Social History Social History   Tobacco Use  . Smoking status: Never Smoker  . Smokeless tobacco: Never Used  Substance Use Topics  . Alcohol use: Yes    Alcohol/week: 0.0 oz    Comment: rare  . Drug use: No     Allergies   Patient has no known allergies.   Review of Systems Review of Systems  Constitutional: Negative for fever.  Gastrointestinal: Negative for nausea and vomiting.  Skin: Positive for wound.  Allergic/Immunologic: Negative for immunocompromised state.  Hematological: Does not bruise/bleed easily.     Physical Exam Updated Vital Signs BP (!) 175/111 (BP Location: Right Arm)   Pulse 76   Temp 98 F (36.7 C) (Oral)   Resp 18   LMP 07/08/2017   SpO2 100%   Physical Exam  Constitutional: She appears well-developed and well-nourished. No distress.  HENT:  Head: Normocephalic and atraumatic.  Eyes: Conjunctivae are normal. No scleral icterus.  Neck: Normal range of motion.  Cardiovascular: Normal rate.  Capillary refill < 3 sec  Pulmonary/Chest: Effort normal.  Abdominal: She exhibits no distension.   Genitourinary:     Genitourinary Comments: Chaperone present 1 x 1 cm mole to the left mons pubis.  Small open wound to the superior portion of the mole where it has detached from the skin.  Small amount of oozing blood.  No erythema, induration or increased warmth to the site.  Musculoskeletal: Normal range of motion.  Neurological: She is alert.  Skin: Skin is warm and dry. She is not diaphoretic.  Psychiatric: Her mood appears anxious.  Pt anxious about the bleeding  Nursing note and vitals reviewed.    ED Treatments / Results   Procedures Procedures (including critical care time)  Medications Ordered in ED Medications - No data to display   Initial Impression / Assessment and Plan / ED Course  I have reviewed the triage vital signs and the nursing notes.  Pertinent labs & imaging results that were available during my care of the patient were reviewed by me and considered in my medical decision making (see chart for details).     Patient with small area of open wound where mole has detached from the skin.  Wound is superficial and barely oozing blood.  No signs of secondary infection.  I offered to numb the patient and revise the small portion of the mole that has detached however patient does not wish for this.  The wound will heal faster if mole is left intact based on the way the wound occurred.  Discussed wound care at home and reasons to return immediately to the emergency department.  Recommended primary care follow-up as needed and dermatology follow-up if patient wanted mole removed completely.  She states understanding and is in agreement with the plan.  BP (!) 190/120 (BP Location: Right Arm)   Pulse 68   Temp 98 F (36.7 C) (Oral)   Resp 16   LMP 07/08/2017   SpO2 100%   Patient noted to be hypertensive in the emergency department.  She reports these readings are normal for him.  No signs of hypertensive urgency.  No chest pain, shortness of breath, vision  changes or other symptoms.  Discussed with patient the need for close follow-up and management by their primary care physician.    Final Clinical Impressions(s) / ED Diagnoses   Final diagnoses:  Wound, open    ED Discharge Orders    None       Loni Muse Gwenlyn Perking 07/23/17 0146    Ezequiel Essex, MD 07/23/17 [phone]

## 2017-07-23 NOTE — Discharge Instructions (Addendum)
1. Medications: usual home medications 2. Treatment: rest, drink plenty of fluids, keep wound clean with warm soap and water; keep wound covered 3. Follow Up: Please followup with your primary doctor in 3-5 days for wound check as needed; if you do not have a primary care doctor use the resource guide provided to find one; Please return to the ER for increasing pain or signs of infection.

## 2017-07-23 NOTE — ED Notes (Signed)
Patient verbalizes understanding of discharge instructions. Opportunity for questioning and answers were provided. Armband removed by staff, pt discharged from ED ambulatory.   

## 2017-12-12 ENCOUNTER — Ambulatory Visit: Payer: BLUE CROSS/BLUE SHIELD | Admitting: Gynecology

## 2017-12-12 ENCOUNTER — Encounter: Payer: Self-pay | Admitting: Gynecology

## 2017-12-12 VITALS — BP 126/80 | Ht 63.0 in | Wt 172.0 lb

## 2017-12-12 DIAGNOSIS — D259 Leiomyoma of uterus, unspecified: Secondary | ICD-10-CM

## 2017-12-12 DIAGNOSIS — A609 Anogenital herpesviral infection, unspecified: Secondary | ICD-10-CM | POA: Diagnosis not present

## 2017-12-12 DIAGNOSIS — N920 Excessive and frequent menstruation with regular cycle: Secondary | ICD-10-CM

## 2017-12-12 DIAGNOSIS — Z13220 Encounter for screening for lipoid disorders: Secondary | ICD-10-CM

## 2017-12-12 DIAGNOSIS — Z01419 Encounter for gynecological examination (general) (routine) without abnormal findings: Secondary | ICD-10-CM

## 2017-12-12 DIAGNOSIS — Z113 Encounter for screening for infections with a predominantly sexual mode of transmission: Secondary | ICD-10-CM

## 2017-12-12 MED ORDER — NORELGESTROMIN-ETH ESTRADIOL 150-35 MCG/24HR TD PTWK: MEDICATED_PATCH | TRANSDERMAL | 4 refills | Status: DC

## 2017-12-12 NOTE — Patient Instructions (Signed)
Follow-up for the ultrasound as scheduled.  Schedule your mammogram this coming year.

## 2017-12-12 NOTE — Progress Notes (Signed)
    Alyssa [name] [birth year] [id number]        42 y.o.  [name] for annual gynecologic exam.  Notes over the past year her menses have gotten heavier work she will pass clots during her patch free week.  No bleeding in between.  History of leiomyoma  Past medical history,surgical history, problem list, medications, allergies, family history and social history were all reviewed and documented as reviewed in the EPIC chart.  ROS:  Performed with pertinent positives and negatives included in the history, assessment and plan.   Additional significant findings : None   Exam: [name] assistant Vitals:   12/12/17 0828  BP: 126/80  Weight: 172 lb (78 kg)  Height: 5\' 3"  (1.6 m)   Body mass index is 30.47 kg/m.  General appearance:  Normal affect, orientation and appearance. Skin: Grossly normal HEENT: Without gross lesions.  No cervical or supraclavicular adenopathy. Thyroid normal.  Lungs:  Clear without wheezing, rales or rhonchi Cardiac: RR, without RMG Abdominal:  Soft, nontender, without masses, guarding, rebound, organomegaly or hernia Breasts:  Examined lying and sitting without masses, retractions, discharge or axillary adenopathy. Pelvic:  Ext, BUS, Vagina: Normal  Cervix: Normal.  GC/Chlamydia  Uterus: Anteverted, normal size, shape and contour, midline and mobile nontender   Adnexa: Without masses or tenderness    Anus and perineum: Normal   Rectovaginal: Normal sphincter tone without palpated masses or tenderness.    Assessment/Plan:  42 y.o. G100P0010 female for annual gynecologic exam with regular menses, Ortho Evra contraception.   1. Menorrhagia.  Patient notes her menses are getting heavier during her patch free week.  No bleeding while on the patches.  History of leiomyoma.  Recommend starting with sonohysterogram to reevaluate the uterus and rule out submucous myomas.  Patient will schedule in follow-up for this.  Check baseline CBC and TSH. 2. STD screening  requested.  No known exposure but wants to be screened.  Refuses HIV testing.  GC/Chlamydia, RPR, hepatitis B, hepatitis C ordered. 3. History of HSV although has not had any recent outbreaks.  Has supply of Valtrex at home that she uses twice daily for 5 days with outbreaks.  She will call if she needs more. 4. Contraception.  Continues on Ortho Evra.  Risks to include increased risk of thrombosis such as stroke heart attack DVT reviewed.  Never smoked and not being followed for medical issues.  Refill x1 year provided. 5. Pap smear/HPV 2017.  History of positive HPV 2015 with normal cytology.  Negative subtype 16, 18/45.  Follow-up Pap smears 2016 and 2017 were negative.  No Pap smear done today.  We will plan repeat Pap smear at 3 to 5-year interval. 6. Breast health.  Breast exam normal today.  History of galactorrhea on the right in the past with normal prolactin levels.  Patient notes she is no longer having galactorrhea.  Recommended baseline mammogram this coming year as she will turn 42. 7. Health maintenance.  Baseline CBC, CMP, lipid profile and TSH ordered.  Follow-up for sonohysterogram.  Follow-up in 1 year for annual exam.   [name] MD, 8:49 AM 12/12/2017

## 2017-12-13 LAB — COMPREHENSIVE METABOLIC PANEL
AG Ratio: 1.2 (calc) (ref 1.0–2.5)
ALBUMIN MSPROF: 3.6 g/dL (ref 3.6–5.1)
ALKALINE PHOSPHATASE (APISO): 36 U/L (ref 33–115)
ALT: 7 U/L (ref 6–29)
AST: 13 U/L (ref 10–30)
BILIRUBIN TOTAL: 0.2 mg/dL (ref 0.2–1.2)
BUN: 8 mg/dL (ref 7–25)
CALCIUM: 9.1 mg/dL (ref 8.6–10.2)
CO2: 24 mmol/L (ref 20–32)
Chloride: 103 mmol/L (ref 98–110)
Creat: 0.8 mg/dL (ref 0.50–1.10)
Globulin: 3.1 g/dL (calc) (ref 1.9–3.7)
Glucose, Bld: 90 mg/dL (ref 65–99)
POTASSIUM: 4.1 mmol/L (ref 3.5–5.3)
SODIUM: 137 mmol/L (ref 135–146)
Total Protein: 6.7 g/dL (ref 6.1–8.1)

## 2017-12-13 LAB — CBC WITH DIFFERENTIAL/PLATELET
BASOS ABS: 22 {cells}/uL (ref 0–200)
Basophils Relative: 0.5 %
EOS ABS: 119 {cells}/uL (ref 15–500)
Eosinophils Relative: 2.7 %
HCT: 36 % (ref 35.0–45.0)
HEMOGLOBIN: 11.4 g/dL — AB (ref 11.7–15.5)
Lymphs Abs: 2156 cells/uL (ref 850–3900)
MCH: 26.6 pg — AB (ref 27.0–33.0)
MCHC: 31.7 g/dL — AB (ref 32.0–36.0)
MCV: 84.1 fL (ref 80.0–100.0)
MPV: 10.5 fL (ref 7.5–12.5)
Monocytes Relative: 9.2 %
NEUTROS ABS: 1698 {cells}/uL (ref 1500–7800)
Neutrophils Relative %: 38.6 %
Platelets: 421 10*3/uL — ABNORMAL HIGH (ref 140–400)
RBC: 4.28 10*6/uL (ref 3.80–5.10)
RDW: 13.7 % (ref 11.0–15.0)
Total Lymphocyte: 49 %
WBC: 4.4 10*3/uL (ref 3.8–10.8)
WBCMIX: 405 {cells}/uL (ref 200–950)

## 2017-12-13 LAB — LIPID PANEL
CHOLESTEROL: 170 mg/dL (ref <200)
HDL: 47 mg/dL — ABNORMAL LOW (ref >50)
LDL CHOLESTEROL (CALC): 103 mg/dL — AB
Non-HDL Cholesterol (Calc): 123 mg/dL (calc) (ref <130)
TRIGLYCERIDES: 102 mg/dL (ref <150)
Total CHOL/HDL Ratio: 3.6 (calc) (ref <5.0)

## 2017-12-13 LAB — RPR: RPR Ser Ql: NONREACTIVE

## 2017-12-13 LAB — HEPATITIS B SURFACE ANTIGEN: Hepatitis B Surface Ag: NONREACTIVE

## 2017-12-13 LAB — HEPATITIS C ANTIBODY
HEP C AB: NONREACTIVE
SIGNAL TO CUT-OFF: 0.04 (ref <1.00)

## 2017-12-13 LAB — TSH: TSH: 2.03 mIU/L

## 2017-12-13 LAB — C. TRACHOMATIS/N. GONORRHOEAE RNA
C. TRACHOMATIS RNA, TMA: NOT DETECTED
N. gonorrhoeae RNA, TMA: NOT DETECTED

## 2017-12-26 ENCOUNTER — Other Ambulatory Visit: Payer: Self-pay | Admitting: Gynecology

## 2017-12-26 DIAGNOSIS — N939 Abnormal uterine and vaginal bleeding, unspecified: Secondary | ICD-10-CM

## 2018-01-05 ENCOUNTER — Encounter: Payer: Self-pay | Admitting: Gynecology

## 2018-01-05 ENCOUNTER — Ambulatory Visit: Payer: BLUE CROSS/BLUE SHIELD | Admitting: Gynecology

## 2018-01-05 ENCOUNTER — Ambulatory Visit (INDEPENDENT_AMBULATORY_CARE_PROVIDER_SITE_OTHER): Payer: BLUE CROSS/BLUE SHIELD

## 2018-01-05 VITALS — BP 124/76

## 2018-01-05 DIAGNOSIS — N939 Abnormal uterine and vaginal bleeding, unspecified: Secondary | ICD-10-CM | POA: Diagnosis not present

## 2018-01-05 DIAGNOSIS — D259 Leiomyoma of uterus, unspecified: Secondary | ICD-10-CM | POA: Diagnosis not present

## 2018-01-05 DIAGNOSIS — N920 Excessive and frequent menstruation with regular cycle: Secondary | ICD-10-CM

## 2018-01-05 MED ORDER — TRANEXAMIC ACID 650 MG PO TABS: 1300.0000 mg | ORAL_TABLET | Freq: Three times a day (TID) | ORAL | 6 refills | Status: DC

## 2018-01-05 NOTE — Progress Notes (Signed)
    Alyssa [name] 06/30/1975 [id number]        42 y.o.  [name] presents for sonohysterogram due to worsening menorrhagia.  Past history of leiomyoma.  On Ortho Evra patches.  Past medical history,surgical history, problem list, medications, allergies, family history and social history were all reviewed and documented in the EPIC chart.  Directed ROS with pertinent positives and negatives documented in the history of present illness/assessment and plan.  Exam: [place] assistant Vitals:   01/05/18 1506  BP: 124/76   General appearance:  Normal Abdomen soft nontender without masses guarding rebound Pelvic external BUS vagina normal.  Cervix normal.  Uterus grossly normal midline mobile nontender.  Adnexa without masses or tenderness.  Ultrasound transvaginal and transabdominal shows uterus mildly enlarged with multiple myomas including 56 mm, 48 mm, 28 mm, 28 mm, 27 mm, 20 mm.  Endometrial echo 3.6 mm.  Right and left ovaries normal.  Fluid in the cul-de-sac 54 x 16 mm noted.  Sonohysterogram performed, sterile technique, easy catheter introduction with good distention and no intracavitary defects.  Endometrial biopsy taken.  Patient tolerated well.  Assessment/Plan:  42 y.o. [name] with menorrhagia and multiple myomas.  Endometrial cavity clear.  Patient will follow-up for biopsy results.  Options for management reviewed to include expectant, hormonal/medication management, Mirena IUD, endometrial ablation, uterine artery embolization, myomectomy, hysterectomy.  The pros and cons of each choice discussed to include the issues of nulliparous status.  I discussed Lysteda trial.  Risks and benefits reviewed.  Increased risk of thrombosis discussed.  Also encouraged patient to consider Mirena IUD.  The patient wants to go ahead and try it Lysteda.  #30, 2 p.o. 3 times daily x5 days with 6 refills provided.  Will call me in follow-up.    [name] MD, 5:09 PM 01/05/2018

## 2018-01-05 NOTE — Patient Instructions (Signed)
Take the Lysteda medication 3 times daily for the first 5 days of your menstrual cycle to lessen the flow.  Call if you have any issues with this.

## 2018-01-09 ENCOUNTER — Telehealth: Payer: Self-pay

## 2018-01-09 NOTE — Telephone Encounter (Signed)
Patient reassured. She wanted you to know she is having a little cramping too, like she has with menses.

## 2018-01-09 NOTE — Telephone Encounter (Signed)
Had [facility] and endo biopsy on 01/05/18.  Said she was told she would see some spotting that day but spotting has continued and is still spotting today.

## 2018-01-09 NOTE — Telephone Encounter (Signed)
A little unusual to spot for that long but sometimes it can persist through the first menses.

## 2018-01-09 NOTE — Telephone Encounter (Signed)
Okay 

## 2018-06-07 ENCOUNTER — Telehealth: Payer: Self-pay | Admitting: *Deleted

## 2018-06-07 NOTE — Telephone Encounter (Signed)
Patient called c/o left side pain x 2 days, rates pain at level 5. Asked if it could be related to fibroids, cycle has not yet started, not taking Lysteda 650 mg tablets due to size of the pills. I offered OV due to pain, patient declined at the time, states she will wait her cycle due to start next week and she will wait to see if pain improves.

## 2018-07-11 ENCOUNTER — Encounter: Payer: Self-pay | Admitting: Gynecology

## 2018-10-10 ENCOUNTER — Encounter: Payer: Self-pay | Admitting: Gynecology

## 2018-10-17 ENCOUNTER — Other Ambulatory Visit: Payer: Self-pay

## 2018-10-17 ENCOUNTER — Ambulatory Visit: Payer: BC Managed Care – PPO | Admitting: Gynecology

## 2018-10-17 ENCOUNTER — Encounter: Payer: Self-pay | Admitting: Gynecology

## 2018-10-17 ENCOUNTER — Telehealth: Payer: Self-pay | Admitting: *Deleted

## 2018-10-17 VITALS — BP 136/86

## 2018-10-17 DIAGNOSIS — N898 Other specified noninflammatory disorders of vagina: Secondary | ICD-10-CM

## 2018-10-17 LAB — WET PREP FOR TRICH, YEAST, CLUE

## 2018-10-17 NOTE — Addendum Note (Signed)
Addended by: [name] on: 10/17/2018 02:45 PM   Modules accepted: Orders

## 2018-10-17 NOTE — Progress Notes (Signed)
    Alyssa [name] 1975-04-25 [id number]        43 y.o.  [name] presents with 2 weeks of discharge following her menses.  Notes that it is actually better now than it was.  No significant irritation or itching at this point.  Was reading on the Internet now she is concerned that she may have an STD.  No urinary symptoms such as frequency dysuria urgency.  Past medical history,surgical history, problem list, medications, allergies, family history and social history were all reviewed and documented in the EPIC chart.  Directed ROS with pertinent positives and negatives documented in the history of present illness/assessment and plan.  Exam: [name] assistant Vitals:   10/17/18 1425  BP: 136/86   General appearance:  Normal Abdomen soft nontender without masses guarding rebound Pelvic external BUS vagina with scant white discharge.  Cervix normal.  Uterus grossly normal midline mobile nontender.  Adnexa without masses or tenderness.  Assessment/Plan:  43 y.o. [name] with history and exam as above.  GC/chlamydia screen of the cervix done.  Wet prep is unremarkable.  As her symptoms seem to be resolving then we discussed monitoring for now.  Assuming she has no persistent irritation or discharge then will follow.  If she does she will call and we will cover her for low-grade bacterial but at this point it seems like everything is resolving on its own.  She will follow-up on her GC and chlamydia screen in several days.   [name] MD, 2:37 PM 10/17/2018

## 2018-10-17 NOTE — Telephone Encounter (Signed)
Patient called c/o clear vaginal discharge x 2 day, no odor, no discharge, no itching. Patient said no discharge now, will watch for now and follow up if continues.

## 2018-10-17 NOTE — Patient Instructions (Signed)
Call if the vaginal discharge seems to linger at all.  Office will follow-up with you with the culture results.

## 2018-10-18 ENCOUNTER — Encounter: Payer: Self-pay | Admitting: Gynecology

## 2018-10-18 LAB — C. TRACHOMATIS/N. GONORRHOEAE RNA
C. trachomatis RNA, TMA: NOT DETECTED
N. gonorrhoeae RNA, TMA: NOT DETECTED

## 2018-10-23 ENCOUNTER — Telehealth: Payer: Self-pay | Admitting: *Deleted

## 2018-10-23 MED ORDER — CLINDAMYCIN PHOSPHATE 2 % VA CREA: 1.0000 | TOPICAL_CREAM | Freq: Every day | VAGINAL | 0 refills | Status: DC

## 2018-10-23 MED ORDER — FLUCONAZOLE 150 MG PO TABS: 150.0000 mg | ORAL_TABLET | Freq: Once | ORAL | 0 refills | Status: DC

## 2018-10-23 NOTE — Telephone Encounter (Signed)
Patient called to follow up from [facility] on 10/17/18, noticed vaginal discharge still lingering. Patient asked if the BV medication and diflucan tablet can be sent to pharmacy. Reports after taking BV medication she will have yeast.  Please advise

## 2018-10-23 NOTE — Telephone Encounter (Signed)
Patient informed with below note, she would prefer the cream. Both Rx sent.

## 2018-10-23 NOTE — Telephone Encounter (Signed)
Flagyl 500 mg twice daily x7 days alcohol avoidance versus Cleocin vaginal cream nightly x7 nights at patient's choice.  Also okay for Diflucan 150 mg x 1 dose.

## 2018-12-13 ENCOUNTER — Other Ambulatory Visit: Payer: Self-pay

## 2018-12-18 ENCOUNTER — Other Ambulatory Visit: Payer: Self-pay

## 2018-12-18 ENCOUNTER — Encounter: Payer: Self-pay | Admitting: Gynecology

## 2018-12-18 ENCOUNTER — Ambulatory Visit: Payer: BC Managed Care – PPO | Admitting: Gynecology

## 2018-12-18 VITALS — BP 120/74 | Ht 63.0 in | Wt 164.0 lb

## 2018-12-18 DIAGNOSIS — Z01419 Encounter for gynecological examination (general) (routine) without abnormal findings: Secondary | ICD-10-CM

## 2018-12-18 DIAGNOSIS — Z13220 Encounter for screening for lipoid disorders: Secondary | ICD-10-CM

## 2018-12-18 DIAGNOSIS — R8290 Unspecified abnormal findings in urine: Secondary | ICD-10-CM

## 2018-12-18 DIAGNOSIS — A609 Anogenital herpesviral infection, unspecified: Secondary | ICD-10-CM

## 2018-12-18 LAB — CBC WITH DIFFERENTIAL/PLATELET
Absolute Monocytes: 616 cells/uL (ref 200–950)
Basophils Absolute: 26 cells/uL (ref 0–200)
Basophils Relative: 0.2 %
Eosinophils Absolute: 26 cells/uL (ref 15–500)
Eosinophils Relative: 0.2 %
HCT: 30.3 % — ABNORMAL LOW (ref 35.0–45.0)
Hemoglobin: 9.2 g/dL — ABNORMAL LOW (ref 11.7–15.5)
Lymphs Abs: 1258 cells/uL (ref 850–3900)
MCH: 23.9 pg — ABNORMAL LOW (ref 27.0–33.0)
MCHC: 30.4 g/dL — ABNORMAL LOW (ref 32.0–36.0)
MCV: 78.7 fL — ABNORMAL LOW (ref 80.0–100.0)
MPV: 10.2 fL (ref 7.5–12.5)
Monocytes Relative: 4.7 %
Neutro Abs: 11174 cells/uL — ABNORMAL HIGH (ref 1500–7800)
Neutrophils Relative %: 85.3 %
Platelets: 653 10*3/uL — ABNORMAL HIGH (ref 140–400)
RBC: 3.85 10*6/uL (ref 3.80–5.10)
RDW: 15 % (ref 11.0–15.0)
Total Lymphocyte: 9.6 %
WBC: 13.1 10*3/uL — ABNORMAL HIGH (ref 3.8–10.8)

## 2018-12-18 LAB — COMPREHENSIVE METABOLIC PANEL
AG Ratio: 1 (calc) (ref 1.0–2.5)
ALT: 10 U/L (ref 6–29)
AST: 16 U/L (ref 10–30)
Albumin: 3.3 g/dL — ABNORMAL LOW (ref 3.6–5.1)
Alkaline phosphatase (APISO): 59 U/L (ref 31–125)
BUN: 12 mg/dL (ref 7–25)
CO2: 23 mmol/L (ref 20–32)
Calcium: 8.8 mg/dL (ref 8.6–10.2)
Chloride: 102 mmol/L (ref 98–110)
Creat: 1.09 mg/dL (ref 0.50–1.10)
Globulin: 3.4 g/dL (calc) (ref 1.9–3.7)
Glucose, Bld: 196 mg/dL — ABNORMAL HIGH (ref 65–99)
Potassium: 4 mmol/L (ref 3.5–5.3)
Sodium: 136 mmol/L (ref 135–146)
Total Bilirubin: 0.3 mg/dL (ref 0.2–1.2)
Total Protein: 6.7 g/dL (ref 6.1–8.1)

## 2018-12-18 LAB — LIPID PANEL
Cholesterol: 151 mg/dL (ref <200)
HDL: 36 mg/dL — ABNORMAL LOW (ref >=50)
LDL Cholesterol (Calc): 90 mg/dL (calc)
Non-HDL Cholesterol (Calc): 115 mg/dL (calc) (ref <130)
Total CHOL/HDL Ratio: 4.2 (calc) (ref <5.0)
Triglycerides: 150 mg/dL — ABNORMAL HIGH (ref <150)

## 2018-12-18 MED ORDER — XULANE 150-35 MCG/24HR TD PTWK: MEDICATED_PATCH | TRANSDERMAL | 4 refills | Status: DC

## 2018-12-18 MED ORDER — VALACYCLOVIR HCL 500 MG PO TABS: 500.0000 mg | ORAL_TABLET | Freq: Two times a day (BID) | ORAL | 3 refills | Status: DC

## 2018-12-18 NOTE — Progress Notes (Signed)
    Alyssa [name] [birth year] [id number]        42 y.o.  [name] for annual gynecologic exam.  Notes some urinary tract symptoms to include frequency dysuria several weeks ago.  Took the pill that her friend had and her symptoms have resolved.  She does note some darkness though to her urine.  No frequency urgency dysuria.  No vaginal discharge or odor.  Past medical history,surgical history, problem list, medications, allergies, family history and social history were all reviewed and documented as reviewed in the EPIC chart.  ROS:  Performed with pertinent positives and negatives included in the history, assessment and plan.   Additional significant findings : None   Exam: [name] assistant Vitals:   12/18/18 0805  BP: 120/74  Weight: 164 lb (74.4 kg)  Height: 5\' 3"  (1.6 m)   Body mass index is 29.05 kg/m.  General appearance:  Normal affect, orientation and appearance. Skin: Grossly normal HEENT: Without gross lesions.  No cervical or supraclavicular adenopathy. Thyroid normal.  Lungs:  Clear without wheezing, rales or rhonchi Cardiac: RR, without RMG Abdominal:  Soft, nontender, without masses, guarding, rebound, organomegaly or hernia Breasts:  Examined lying and sitting without masses, retractions, discharge or axillary adenopathy. Pelvic:  Ext, BUS, Vagina: Normal  Cervix: Normal  Uterus: Anteverted, normal size, shape and contour, midline and mobile nontender   Adnexa: Without masses or tenderness    Anus and perineum: Normal   Rectovaginal: Normal sphincter tone without palpated masses or tenderness.    Assessment/Plan:  43 y.o. G35P0010 female for annual gynecologic exam.  With regular menses, Ortho Evra contraception  1. Contraception.  Using Ortho Evra and doing well with this.  No medical issues and never smoked.  Refill x1 year provided. 2. Pap smear/HPV negative 2017 and 2016.  Positive HPV with normal cytology 2015.  Negative subtype 16, 18/45.  No Pap  smear done today.  We will plan repeat Pap smear/HPV at 5-year interval per current screening guidelines. 3. Darker urine noted.  No other signs of UTI.  Check urine analysis today. 4. History of HSV.  Uses Valtrex intermittently with outbreaks.  #60 with 3 refills provided.  1 p.o. twice daily at earliest onset of symptoms times several days. 5. Mammography 06/2018.  Continue with annual mammography when due.  Breast exam normal today.  History of occasional galactorrhea on the right.  Normal prolactin levels in the past.  Never bloody.  Follow-up and report any blood or palpable abnormalities. 6. STD screening discussed and declined. 7. Health maintenance.  Baseline CBC, CMP and lipid profile ordered.  Follow-up 1 year, sooner as needed.   [name] MD, 8:29 AM 12/18/2018

## 2018-12-18 NOTE — Patient Instructions (Signed)
Follow-up in 1 year for annual exam, sooner if any issues. 

## 2018-12-19 ENCOUNTER — Telehealth: Payer: Self-pay

## 2018-12-19 ENCOUNTER — Other Ambulatory Visit: Payer: Self-pay

## 2018-12-19 DIAGNOSIS — D649 Anemia, unspecified: Secondary | ICD-10-CM

## 2018-12-19 DIAGNOSIS — R7309 Other abnormal glucose: Secondary | ICD-10-CM

## 2018-12-19 MED ORDER — FLUCONAZOLE 150 MG PO TABS: 150.0000 mg | ORAL_TABLET | Freq: Once | ORAL | 0 refills | Status: DC

## 2018-12-19 MED ORDER — NITROFURANTOIN MONOHYD MACRO 100 MG PO CAPS: 100.0000 mg | ORAL_CAPSULE | Freq: Two times a day (BID) | ORAL | 0 refills | Status: DC

## 2018-12-19 NOTE — Telephone Encounter (Signed)
Patient asked if you will ok a Diflucan tablet for her since she is having to take antibiotic and always gets a yeast infection.

## 2018-12-19 NOTE — Telephone Encounter (Signed)
Okay for Diflucan 150 mg x 1 dose 

## 2018-12-19 NOTE — Telephone Encounter (Signed)
Rx's sent for Macrobid and Diflucan.

## 2018-12-19 NOTE — Telephone Encounter (Signed)
-----   Message from [name], MD sent at 12/19/2018  7:55 AM EST ----- Tell patient:  1.  Urine analysis does look like UTI.  Recommend Macrobid 100 mg twice daily x7 days. 2.  Glucose elevated.  Recommend fasting glucose and hemoglobin A1c 3.  Blood count low.  May be due to blood loss from her menses although I wonder verify its not lab error.  I would like to repeat a CBC with differential when she does the other blood work.

## 2018-12-20 ENCOUNTER — Telehealth: Payer: Self-pay | Admitting: *Deleted

## 2018-12-20 ENCOUNTER — Other Ambulatory Visit: Payer: BC Managed Care – PPO

## 2018-12-20 ENCOUNTER — Other Ambulatory Visit: Payer: Self-pay

## 2018-12-20 DIAGNOSIS — D649 Anemia, unspecified: Secondary | ICD-10-CM

## 2018-12-20 DIAGNOSIS — R7309 Other abnormal glucose: Secondary | ICD-10-CM

## 2018-12-20 NOTE — Telephone Encounter (Signed)
Patient was prescribed Macrobid 100 mg bid x 7 days on 12/19/18, patient said this am since noticed pink discharge when wiping. Cycle not due to start until next week, asked if medication could cause cycle to start earlier? Please advise

## 2018-12-20 NOTE — Telephone Encounter (Signed)
Patient informed, Rx sent.  

## 2018-12-20 NOTE — Telephone Encounter (Signed)
Usually does not cause bleeding.  I would monitor through her menses and follow-up if irregular bleeding would continue.

## 2018-12-21 LAB — CULTURE INDICATED

## 2018-12-21 LAB — URINE CULTURE
MICRO NUMBER:: 1149690
SPECIMEN QUALITY:: ADEQUATE

## 2018-12-21 LAB — URINALYSIS, COMPLETE W/RFL CULTURE
Bilirubin Urine: NEGATIVE
Glucose, UA: NEGATIVE
Hyaline Cast: NONE SEEN /LPF
Nitrites, Initial: POSITIVE — AB
Specific Gravity, Urine: 1.021 (ref 1.001–1.03)
WBC, UA: >=60 /HPF — AB (ref 0–5)
pH: 5.5 (ref 5.0–8.0)

## 2018-12-22 LAB — CBC WITH DIFFERENTIAL/PLATELET
Absolute Monocytes: 755 cells/uL (ref 200–950)
Basophils Absolute: 30 cells/uL (ref 0–200)
Basophils Relative: 0.4 %
Eosinophils Absolute: 30 cells/uL (ref 15–500)
Eosinophils Relative: 0.4 %
HCT: 31 % — ABNORMAL LOW (ref 35.0–45.0)
Hemoglobin: 9.5 g/dL — ABNORMAL LOW (ref 11.7–15.5)
Lymphs Abs: 1473 cells/uL (ref 850–3900)
MCH: 24.1 pg — ABNORMAL LOW (ref 27.0–33.0)
MCHC: 30.6 g/dL — ABNORMAL LOW (ref 32.0–36.0)
MCV: 78.5 fL — ABNORMAL LOW (ref 80.0–100.0)
MPV: 10.5 fL (ref 7.5–12.5)
Monocytes Relative: 10.2 %
Neutro Abs: 5113 cells/uL (ref 1500–7800)
Neutrophils Relative %: 69.1 %
Platelets: 713 10*3/uL — ABNORMAL HIGH (ref 140–400)
RBC: 3.95 10*6/uL (ref 3.80–5.10)
RDW: 15 % (ref 11.0–15.0)
Total Lymphocyte: 19.9 %
WBC: 7.4 10*3/uL (ref 3.8–10.8)

## 2018-12-22 LAB — EXTRA SPECIMEN

## 2018-12-22 LAB — HEMOGLOBIN A1C
Hgb A1c MFr Bld: 5.7 % of total Hgb — ABNORMAL HIGH (ref <5.7)
Mean Plasma Glucose: 117 (calc)
eAG (mmol/L): 6.5 (calc)

## 2018-12-22 LAB — TEST AUTHORIZATION

## 2018-12-22 LAB — GLUCOSE, RANDOM: Glucose, Bld: 92 mg/dL (ref 65–99)

## 2018-12-25 ENCOUNTER — Other Ambulatory Visit: Payer: Self-pay

## 2018-12-25 DIAGNOSIS — R7309 Other abnormal glucose: Secondary | ICD-10-CM

## 2018-12-25 DIAGNOSIS — D649 Anemia, unspecified: Secondary | ICD-10-CM

## 2019-03-26 ENCOUNTER — Other Ambulatory Visit: Payer: Self-pay

## 2019-03-26 ENCOUNTER — Other Ambulatory Visit: Payer: BC Managed Care – PPO

## 2019-03-26 DIAGNOSIS — D649 Anemia, unspecified: Secondary | ICD-10-CM

## 2019-03-26 DIAGNOSIS — R7309 Other abnormal glucose: Secondary | ICD-10-CM

## 2019-03-26 LAB — CBC WITH DIFFERENTIAL/PLATELET
Absolute Monocytes: 403 cells/uL (ref 200–950)
Basophils Absolute: 31 cells/uL (ref 0–200)
Basophils Relative: 0.6 %
Eosinophils Absolute: 209 cells/uL (ref 15–500)
Eosinophils Relative: 4.1 %
HCT: 32.1 % — ABNORMAL LOW (ref 35.0–45.0)
Hemoglobin: 10.1 g/dL — ABNORMAL LOW (ref 11.7–15.5)
Lymphs Abs: 2387 cells/uL (ref 850–3900)
MCH: 25.3 pg — ABNORMAL LOW (ref 27.0–33.0)
MCHC: 31.5 g/dL — ABNORMAL LOW (ref 32.0–36.0)
MCV: 80.5 fL (ref 80.0–100.0)
MPV: 10.2 fL (ref 7.5–12.5)
Monocytes Relative: 7.9 %
Neutro Abs: 2071 cells/uL (ref 1500–7800)
Neutrophils Relative %: 40.6 %
Platelets: 496 10*3/uL — ABNORMAL HIGH (ref 140–400)
RBC: 3.99 10*6/uL (ref 3.80–5.10)
RDW: 15.5 % — ABNORMAL HIGH (ref 11.0–15.0)
Total Lymphocyte: 46.8 %
WBC: 5.1 10*3/uL (ref 3.8–10.8)

## 2019-03-26 LAB — GLUCOSE, RANDOM: Glucose, Bld: 92 mg/dL (ref 65–99)

## 2019-03-30 ENCOUNTER — Telehealth: Payer: Self-pay

## 2019-03-30 ENCOUNTER — Other Ambulatory Visit: Payer: Self-pay

## 2019-03-30 DIAGNOSIS — D500 Iron deficiency anemia secondary to blood loss (chronic): Secondary | ICD-10-CM

## 2019-03-30 NOTE — Telephone Encounter (Signed)
Encounter opened in error

## 2019-03-30 NOTE — Progress Notes (Signed)
I assume these are just routine screening labs or is there a certain reason why these were checked?  Fasting glucose is normal, hemoglobin appears slightly better than it was before but she is still anemic.  Has she ever been checked for sickle cell, if not then perhaps she could consider doing that.

## 2019-03-30 NOTE — Progress Notes (Signed)
Thank you :)

## 2019-04-02 ENCOUNTER — Other Ambulatory Visit: Payer: Self-pay

## 2019-04-02 ENCOUNTER — Other Ambulatory Visit: Payer: BC Managed Care – PPO

## 2019-04-02 DIAGNOSIS — D500 Iron deficiency anemia secondary to blood loss (chronic): Secondary | ICD-10-CM

## 2019-04-03 ENCOUNTER — Other Ambulatory Visit: Payer: Self-pay

## 2019-04-03 DIAGNOSIS — R739 Hyperglycemia, unspecified: Secondary | ICD-10-CM

## 2019-04-03 DIAGNOSIS — D500 Iron deficiency anemia secondary to blood loss (chronic): Secondary | ICD-10-CM

## 2019-04-03 LAB — SICKLE CELL SCREEN: Sickle Solubility Test - HGBRFX: NEGATIVE

## 2019-04-03 NOTE — Progress Notes (Signed)
Please let the patient know that her sickle cell screen was negative.

## 2019-04-10 ENCOUNTER — Other Ambulatory Visit: Payer: Self-pay

## 2019-04-11 ENCOUNTER — Encounter: Payer: Self-pay | Admitting: Obstetrics and Gynecology

## 2019-04-11 ENCOUNTER — Ambulatory Visit: Payer: BC Managed Care – PPO | Admitting: Obstetrics and Gynecology

## 2019-04-11 VITALS — BP 124/82

## 2019-04-11 DIAGNOSIS — D259 Leiomyoma of uterus, unspecified: Secondary | ICD-10-CM

## 2019-04-11 DIAGNOSIS — D500 Iron deficiency anemia secondary to blood loss (chronic): Secondary | ICD-10-CM | POA: Diagnosis not present

## 2019-04-11 DIAGNOSIS — N898 Other specified noninflammatory disorders of vagina: Secondary | ICD-10-CM

## 2019-04-11 DIAGNOSIS — N920 Excessive and frequent menstruation with regular cycle: Secondary | ICD-10-CM | POA: Diagnosis not present

## 2019-04-11 DIAGNOSIS — R350 Frequency of micturition: Secondary | ICD-10-CM | POA: Diagnosis not present

## 2019-04-11 DIAGNOSIS — N643 Galactorrhea not associated with childbirth: Secondary | ICD-10-CM

## 2019-04-11 LAB — WET PREP FOR TRICH, YEAST, CLUE

## 2019-04-11 NOTE — Progress Notes (Addendum)
Alyssa [name]  03-25-1975 [id number]  HPI The patient is a 44 y.o. [name] who presents today for discussion of options for persistent heavy periods, and also concern over ongoing right breast nipple discharge. The nipple discharge has been intermittently present for the past few years, previously evaluated in 2018 with a normal prolactin level.  Her mammograms have been normal.  The discharge is only from her right breast, it is never cloudy or bloody.  There is no associated pain.  No drainage from the contralateral breast. She had worsening menorrhagia in December 2019 and had a work-up including a sonohysterogram which indicated a thin endometrium with a mildly enlarged uterus with several uterine fibroids, the largest measuring 5.6 cm per the ultrasound report.  She had endometrial biopsy which showed insufficient tissue for diagnosis.  She decided not to re-biopsy and instead just monitor her symptoms.  She says she has been having heavy periods. Needing to wear largest pad during her period and in the past year or so she gets a clearish discharge after her period is done and this lasts for a few weeks, and she needs to wear a liner for most of her time between her cycles.  Clear discharge without odor or irritation, no spotting or bleeding in between periods.  She does have slightly increased urinary frequency, but denies any leakage or stress urinary incontinence symptoms.  She continues to take the Ortho Evra patch as prescribed.  She does have a chronic microcytic anemia that has developed in the past year.  Her sickle cell screen was recently negative. She changes her patch every Sunday. Cycle starts Wednesday lasts about 4 days until Sunday.  She says the first 2 days are quite heavy in the last few years requiring several pad changes in a day.  In the past, she was trialed on Lysteda but had a problem with tolerating the size of the pills so she did not want to take it.  She also had a hard  time remembering to take contraceptive pills in the past.  She indicates that she is not interested in childbearing at this point and would like to consider a hysterectomy.   Past medical history,surgical history, problem list, medications, allergies, family history and social history were all reviewed and documented as reviewed in the EPIC chart.  ROS:  Feeling well. No dyspnea or chest pain on exertion.  No abdominal pain, change in bowel habits, black or bloody stools.  No urinary tract symptoms. GYN ROS: normal menses, + abnormal bleeding, no pelvic pain, + clear vaginal discharge, no breast pain or new or enlarging lumps on self exam. +right galactorrhea.  No neurological complaints.  Physical Exam  BP 124/82   LMP 03/28/2019   General: Pleasant female, no acute distress, alert and oriented   BREAST EXAM: right breast normal without mass, skin or nipple changes or axillary nodes, unable to express fluid from the right nipple with gentle effort over the area  PELVIC EXAM: VULVA: normal appearing vulva with no masses, tenderness or lesions, VAGINA: normal appearing vagina with normal color and scant amount of clear discharge without odor, no lesions, CERVIX: normal appearing cervix without discharge or lesions, UTERUS: uterus is normal size, shape, consistency and nontender, ADNEXA: normal adnexa in size, nontender and no masses WET MOUNT done - results: negative for pathogens, normal epithelial cells, few WBC, moderate bacteria, 7-12 epithelial cells/hpf  Urinalysis: Dark yellow and cloudy urine, negative for ketones, nitrate, leukocyte esterase.  0-5 WBCs,  no RBC, 20-40 squamous epithelial cells.  Moderate bacteria.  Moderate mucus. Previous pelvic ultrasound imaging from 2019 is personally reviewed   Assessment 44 yo with abnormal uterine bleeding, known fibroid uterus, microcytic anemia, persistent clear vaginal discharge of uncertain origin, galactorrhea  Plan  We discussed options  such as changing her contraception type, or changing use of the Ortho Evra patch to an extended regimen if insurance would allow.  This may help modulate her hormones and help reduce her menstrual bleeding by lessening the frequency of her periods, and also possibly result in improvement in the vaginal discharge that she is having which is not due to an infectious etiology by today's testing, therefore, it is likely hormonal related.  We also discussed considering trialing oral contraceptive pills, but she is not interested as she has a hard time remembering to take these.  She was not willing to take Lysteda in the past due to the size of the pill and her inability to tolerate pills in general.  I offered her the hormonal IUD and she is not interested in trialing it as she is not comfortable with the idea.  She became tearful during our discussion today.  Presumably she is having considerably heavier periods as she has become anemic in the last year.  It has been previously recommended that she start an iron supplement.  She seemed fairly convinced at the beginning of the visit that she wanted to pursue a hysterectomy, but we did discuss the surgery, the expected recovery, potential risks of doing the surgery including infection and the organ injury discussion, which she said made her scared to proceed.  I reassured her that we do not want to scare her away from considering surgery, but this is necessary information to be familiar with and to consider prior to consenting to a major surgery like a hysterectomy.  Also discussed the possibility of endometrial ablation, as her fibroids appear to be intramural on her most recent imaging, these should not present a problem for doing an endometrial ablation if she should so choose to.  She understands that with an endometrial ablation in the setting of fibroids, she may be less likely to achieve acceptable results, and the endometrial ablation is more likely to fail and  may need to consider a hysterectomy in the future anyway.  She is provided with the [employer] information pamphlets on hysterectomy and endometrial ablation.  In regard to the galactorrhea, there are no abnormal findings on her breast exam today.  There is no evidence of galactorrhea during the exam and none could be expressed.  Normal mammogram noted 9 months ago.  I did recommend rechecking prolactin and TSH, but she indicates that she does not want to get any more blood testing today since she just had some blood tests done.  I told her this is the recommendation and she may choose to proceed if she would like to.  Otherwise, all symptoms that she describes in relation to the galactorrhea sound to describe a benign condition.  She will consider all of the above and will let me know how she would like to proceed   Joseph Pierini MD, Cherlynn June 04/11/19

## 2019-04-13 LAB — URINALYSIS, COMPLETE W/RFL CULTURE
Bilirubin Urine: NEGATIVE
Glucose, UA: NEGATIVE
Hyaline Cast: NONE SEEN /LPF
Ketones, ur: NEGATIVE
Leukocyte Esterase: NEGATIVE
Nitrites, Initial: NEGATIVE
RBC / HPF: NONE SEEN /HPF (ref 0–2)
Specific Gravity, Urine: 1.017 (ref 1.001–1.03)
pH: 6 (ref 5.0–8.0)

## 2019-04-13 LAB — URINE CULTURE
MICRO NUMBER:: 10287367
SPECIMEN QUALITY:: ADEQUATE

## 2019-04-13 LAB — CULTURE INDICATED

## 2019-07-18 ENCOUNTER — Encounter: Payer: Self-pay | Admitting: Obstetrics & Gynecology

## 2019-07-18 ENCOUNTER — Telehealth: Payer: Self-pay | Admitting: *Deleted

## 2019-07-18 NOTE — Telephone Encounter (Signed)
[facility] called stating patient was scheduled for mammogram screening, however arrived c/o bilateral breast pain, they asked if okay to proceed with diag. Mammogram. Okay given via phone to proceed.

## 2019-07-24 ENCOUNTER — Other Ambulatory Visit: Payer: Self-pay | Admitting: Radiology

## 2019-07-24 DIAGNOSIS — N6452 Nipple discharge: Secondary | ICD-10-CM

## 2019-08-07 ENCOUNTER — Other Ambulatory Visit: Payer: BC Managed Care – PPO

## 2019-08-07 ENCOUNTER — Other Ambulatory Visit: Payer: Self-pay | Admitting: Nurse Practitioner

## 2019-08-07 ENCOUNTER — Other Ambulatory Visit: Payer: Self-pay

## 2019-08-07 ENCOUNTER — Telehealth: Payer: Self-pay | Admitting: *Deleted

## 2019-08-07 DIAGNOSIS — N3001 Acute cystitis with hematuria: Secondary | ICD-10-CM

## 2019-08-07 DIAGNOSIS — R350 Frequency of micturition: Secondary | ICD-10-CM

## 2019-08-07 DIAGNOSIS — B3731 Acute candidiasis of vulva and vagina: Secondary | ICD-10-CM

## 2019-08-07 MED ORDER — FLUCONAZOLE 150 MG PO TABS: 150.0000 mg | ORAL_TABLET | Freq: Once | ORAL | 0 refills | Status: DC

## 2019-08-07 MED ORDER — SULFAMETHOXAZOLE-TRIMETHOPRIM 800-160 MG PO TABS: 1.0000 | ORAL_TABLET | Freq: Two times a day (BID) | ORAL | 0 refills | Status: AC

## 2019-08-07 NOTE — Telephone Encounter (Signed)
Yes I would like it to be ran today if possible

## 2019-08-07 NOTE — Telephone Encounter (Signed)
I would like to get a urine from her prior to treating. Can she come in just for that today? I do not have any openings tomorrow either.

## 2019-08-07 NOTE — Telephone Encounter (Signed)
Patient will be here in the next 15 minutes to leave a U/A, I assume you want lab tech to run urine today? Typically when U/A are dropped off they don't run the same day.

## 2019-08-07 NOTE — Progress Notes (Signed)
Patient called with c/o urgency, frequency, and urinary incontinence  UA: Blood 2+, leukocytes 2+, protein 2+, wbc >60 Bactrim DS 800-160 mg BID x 5 days. Culture pending. Diflucan 150 mg tablet x1 after completion of antibiotic due to h/o yeast infections with use.

## 2019-08-07 NOTE — Telephone Encounter (Signed)
(  Dr.Kendall patient) Patient called questioning if she has UTI, day 2 of frequent urination, pressure, no burning with urination, however reports body tingles when urinating. States x 2 days she is not able to make to the bathroom with wetting her pants. Per appointment desk no visits for today, but could schedule visit tomorrow, but her cycle is due to start tomorrow and worried that U/A won't be any good due to heavy bleeding. She asked for your recommendations/ thoughts.  Please advise

## 2019-08-07 NOTE — Telephone Encounter (Signed)
I sent Onalee Hua a teams message to have lab check u/a.

## 2019-08-10 LAB — URINALYSIS, COMPLETE W/RFL CULTURE
Bilirubin Urine: NEGATIVE
Glucose, UA: NEGATIVE
Hyaline Cast: NONE SEEN /LPF
Ketones, ur: NEGATIVE
Nitrites, Initial: NEGATIVE
Specific Gravity, Urine: 1.025 (ref 1.001–1.03)
WBC, UA: >=60 /HPF — AB (ref 0–5)
pH: 6 (ref 5.0–8.0)

## 2019-08-10 LAB — URINE CULTURE
MICRO NUMBER:: 10726422
SPECIMEN QUALITY:: ADEQUATE

## 2019-08-10 LAB — CULTURE INDICATED

## 2019-08-21 ENCOUNTER — Telehealth: Payer: Self-pay | Admitting: *Deleted

## 2019-08-21 ENCOUNTER — Ambulatory Visit
Admission: RE | Admit: 2019-08-21 | Discharge: 2019-08-21 | Disposition: A | Discharge status: Home / Self Care | Payer: BC Managed Care – PPO | Source: Ambulatory Visit | Attending: Radiology | Admitting: Radiology

## 2019-08-21 ENCOUNTER — Other Ambulatory Visit: Payer: Self-pay

## 2019-08-21 DIAGNOSIS — N6452 Nipple discharge: Secondary | ICD-10-CM

## 2019-08-21 MED ORDER — XULANE 150-35 MCG/24HR TD PTWK: 1.0000 | MEDICATED_PATCH | TRANSDERMAL | 0 refills | Status: DC

## 2019-08-21 MED ORDER — GADOBUTROL 1 MMOL/ML IV SOLN
7.0000 mL | Freq: Once | INTRAVENOUS | Status: AC | PRN
  Administered 2019-08-21: 7 mL via INTRAVENOUS

## 2019-08-21 NOTE — Telephone Encounter (Signed)
Patient is at [facility] for breast MRI order by [facility] Partridge patient said she was told to remove her Xulane patch by tech at [facility] wanted to know if 1 patch could be sent to local pharmacy to replace this patch. Rx sent to local pharmacy.

## 2019-12-20 ENCOUNTER — Encounter: Payer: Self-pay | Admitting: Obstetrics & Gynecology

## 2019-12-20 ENCOUNTER — Telehealth: Payer: Self-pay | Admitting: *Deleted

## 2019-12-20 ENCOUNTER — Other Ambulatory Visit: Payer: Self-pay

## 2019-12-20 ENCOUNTER — Ambulatory Visit (INDEPENDENT_AMBULATORY_CARE_PROVIDER_SITE_OTHER): Payer: BC Managed Care – PPO | Admitting: Obstetrics & Gynecology

## 2019-12-20 VITALS — BP 126/78 | Ht 63.0 in | Wt 174.0 lb

## 2019-12-20 DIAGNOSIS — Z01419 Encounter for gynecological examination (general) (routine) without abnormal findings: Secondary | ICD-10-CM | POA: Diagnosis not present

## 2019-12-20 DIAGNOSIS — Z3045 Encounter for surveillance of transdermal patch hormonal contraceptive device: Secondary | ICD-10-CM

## 2019-12-20 DIAGNOSIS — Z8619 Personal history of other infectious and parasitic diseases: Secondary | ICD-10-CM

## 2019-12-20 DIAGNOSIS — N920 Excessive and frequent menstruation with regular cycle: Secondary | ICD-10-CM

## 2019-12-20 DIAGNOSIS — N898 Other specified noninflammatory disorders of vagina: Secondary | ICD-10-CM | POA: Diagnosis not present

## 2019-12-20 DIAGNOSIS — D500 Iron deficiency anemia secondary to blood loss (chronic): Secondary | ICD-10-CM

## 2019-12-20 DIAGNOSIS — A64 Unspecified sexually transmitted disease: Secondary | ICD-10-CM

## 2019-12-20 DIAGNOSIS — D259 Leiomyoma of uterus, unspecified: Secondary | ICD-10-CM

## 2019-12-20 DIAGNOSIS — N643 Galactorrhea not associated with childbirth: Secondary | ICD-10-CM

## 2019-12-20 LAB — WET PREP FOR TRICH, YEAST, CLUE

## 2019-12-20 MED ORDER — SULFAMETHOXAZOLE-TRIMETHOPRIM 800-160 MG PO TABS: 1.0000 | ORAL_TABLET | Freq: Two times a day (BID) | ORAL | 0 refills | Status: AC

## 2019-12-20 MED ORDER — TINIDAZOLE 500 MG PO TABS: 1000.0000 mg | ORAL_TABLET | Freq: Two times a day (BID) | ORAL | 1 refills | Status: AC

## 2019-12-20 MED ORDER — FLUCONAZOLE 150 MG PO TABS: 150.0000 mg | ORAL_TABLET | Freq: Every day | ORAL | 0 refills | Status: DC

## 2019-12-20 MED ORDER — SULFAMETHOXAZOLE-TRIMETHOPRIM 800-160 MG PO TABS: 1.0000 | ORAL_TABLET | Freq: Two times a day (BID) | ORAL | 0 refills | Status: DC

## 2019-12-20 MED ORDER — VALACYCLOVIR HCL 500 MG PO TABS: 500.0000 mg | ORAL_TABLET | Freq: Two times a day (BID) | ORAL | 3 refills | Status: AC

## 2019-12-20 MED ORDER — FLUCONAZOLE 150 MG PO TABS
150.0000 mg | ORAL_TABLET | Freq: Once | ORAL | 0 refills | Status: DC
Start: 2019-12-20 — End: 2019-12-20

## 2019-12-20 MED ORDER — XULANE 150-35 MCG/24HR TD PTWK: MEDICATED_PATCH | TRANSDERMAL | 4 refills | Status: DC

## 2019-12-20 MED ORDER — TINIDAZOLE 500 MG PO TABS
1000.0000 mg | ORAL_TABLET | Freq: Two times a day (BID) | ORAL | 1 refills | Status: DC
Start: 2019-12-20 — End: 2019-12-20

## 2019-12-20 NOTE — Telephone Encounter (Signed)
Patient was seen today and prescribed diflucan 150 mg tablet. # 1 tablet. Patient said she thought diflucan 150 mg # 2 tablets was going to be prescribed, but only 1 tablet sent? Please advise

## 2019-12-20 NOTE — Telephone Encounter (Signed)
Patient informed, Rx sent.  

## 2019-12-20 NOTE — Progress Notes (Signed)
Alyssa [name] 10/13/1975 [id number]   History:    44 y.o. G1P0A1  RP:  Established patient presenting for annual gyn exam   HPI: Well on Xulane patch, but heavy menses.  No breakthrough bleeding.  No pelvic pain.  Complaints of a pretty liquid abundant frequent vaginal discharge.  Rt nipple discharge.  No breast pain or lumps.  Urine and bowel movements normal.  Body mass index 30.82.  Past medical history,surgical history, family history and social history were all reviewed and documented in the EPIC chart.  Gynecologic History Patient's last menstrual period was 11/29/2019.  Obstetric History OB History  Gravida Para Term Preterm AB Living  1       1 0  SAB TAB Ectopic Multiple Live Births               # Outcome Date GA Lbr Len/2nd Weight Sex Delivery Anes PTL Lv  1 AB              ROS: A ROS was performed and pertinent positives and negatives are included in the history.  GENERAL: No fevers or chills. HEENT: No change in vision, no earache, sore throat or sinus congestion. NECK: No pain or stiffness. CARDIOVASCULAR: No chest pain or pressure. No palpitations. PULMONARY: No shortness of breath, cough or wheeze. GASTROINTESTINAL: No abdominal pain, nausea, vomiting or diarrhea, melena or bright red blood per rectum. GENITOURINARY: No urinary frequency, urgency, hesitancy or dysuria. MUSCULOSKELETAL: No joint or muscle pain, no back pain, no recent trauma. DERMATOLOGIC: No rash, no itching, no lesions. ENDOCRINE: No polyuria, polydipsia, no heat or cold intolerance. No recent change in weight. HEMATOLOGICAL: No anemia or easy bruising or bleeding. NEUROLOGIC: No headache, seizures, numbness, tingling or weakness. PSYCHIATRIC: No depression, no loss of interest in normal activity or change in sleep pattern.     Exam:   BP 126/78   Ht 5' 3"  (1.6 m)   Wt 174 lb (78.9 kg)   LMP 11/29/2019 Comment: patch  BMI 30.82 kg/m   Body mass index is 30.82 kg/m.  General appearance  : Well developed well nourished female. No acute distress HEENT: Eyes: no retinal hemorrhage or exudates,  Neck supple, trachea midline, no carotid bruits, no thyroidmegaly Lungs: Clear to auscultation, no rhonchi or wheezes, or rib retractions  Heart: Regular rate and rhythm, no murmurs or gallops Breast:Examined in sitting and supine position were symmetrical in appearance, no palpable masses or tenderness,  no skin retraction, no nipple inversion, no nipple discharge, no skin discoloration, no axillary or supraclavicular lymphadenopathy Abdomen: no palpable masses or tenderness, no rebound or guarding Extremities: no edema or skin discoloration or tenderness  Pelvic: Vulva: Normal             Vagina: No gross lesions or discharge  Cervix: No gross lesions or discharge.  Pap reflex/Gono-Chlam done  Uterus  AV, about 9 cm nodular, non-tender and mobile  Adnexa  Without masses or tenderness  Anus: Normal  Wet prep:  Clue cells present U/A: Yellow cloudy, protein 2+, nitrites negative, white blood cells 6-10, red blood cells negative, many bacteria.  Moderate clue cells present.  Urine culture pending.   Assessment/Plan:  44 y.o. female for annual exam   1. Encounter for routine gynecological examination with Papanicolaou smear of cervix Normal gynecologic exam.  Pap reflex done.  Breast exam normal.  No galactorrhea on exam.  Fasting health labs here today. - CBC - Comp Met (CMET) - Lipid panel -  TSH - VITAMIN D 25 Hydroxy (Vit-D Deficiency, Fractures)  2. Encounter for surveillance of transdermal patch hormonal contraceptive device Well on the Xulane patch, but still heavy menses.  Recommend using continuously.  Usage reviewed with patient.  Prescription sent accordingly.  3. Menorrhagia with regular cycle Menorrhagia not completely controlled on the Xulane patch.  Will try continuous use.  CBC drawn today.  Follow-up with a pelvic ultrasound to further investigate. - US  Transvaginal Non-OB; Future  4. Anemia due to chronic blood loss Menorrhagia with fibroids and secondary anemia.  Follow-up pelvic ultrasound to evaluate.  CBC drawn.  Recommend iron sulfate 325 mg per mouth twice daily. - US Transvaginal Non-OB; Future  5. Uterine leiomyoma, unspecified location Known uterine fibroids, uterus about 9 cm in diameter.  6. Vaginal discharge Bacterial vaginosis confirmed by wet prep.  Will treat with tinidazole.  Usage reviewed and prescription sent to pharmacy.  Probable acute cystitis per urine analysis.  Will treat with Bactrim DS twice a day for 3 days.  Usage reviewed and prescription sent to pharmacy.  Patient will take Diflucan after both antibiotic treatments. - WET PREP FOR TRICH, YEAST, CLUE - U/A  7. STD (sexually transmitted disease) Condom use.  Declines HIV. - Gono-Chlam on Pap - RPR - Hepatitis B Surface AntiGEN - Hepatitis C Antibody  8. Galactorrhea Right galacturia, not documented on exam today.  Prolactin drawn. - Prolactin  9. H/O herpes genitalis Prescription for episodic valacyclovir sent to pharmacy.  Other orders - norelgestromin-ethinyl estradiol [name]) 150-35 MCG/24HR transdermal patch; APPLY 1 PATCH ON SKIN ONCE WEEKLY, CONTINUOUS USE FOR MENORRHAGIA - valACYclovir (VALTREX) 500 MG tablet; Take 1 tablet (500 mg total) by mouth 2 (two) times daily for 5 days. With outbreak - sulfamethoxazole-trimethoprim (BACTRIM DS) 800-160 MG tablet; Take 1 tablet by mouth 2 (two) times daily for 3 days. - tinidazole (TINDAMAX) 500 MG tablet; Take 2 tablets (1,000 mg total) by mouth 2 (two) times daily for 2 doses. - Urine Culture - REFLEXIVE URINE CULTURE  [name] MD, 8:16 AM 12/20/2019

## 2019-12-20 NOTE — Telephone Encounter (Signed)
She is right, it's because we changed from the mail pharmacy to her local pharmacy...  Agree with prescribing Fluconazole 150 mg 1 tab PO daily x 2.

## 2019-12-21 ENCOUNTER — Encounter: Payer: Self-pay | Admitting: Obstetrics & Gynecology

## 2019-12-21 LAB — CBC
HCT: 30.9 % — ABNORMAL LOW (ref 35.0–45.0)
Hemoglobin: 9.5 g/dL — ABNORMAL LOW (ref 11.7–15.5)
MCH: 24.1 pg — ABNORMAL LOW (ref 27.0–33.0)
MCHC: 30.7 g/dL — ABNORMAL LOW (ref 32.0–36.0)
MCV: 78.4 fL — ABNORMAL LOW (ref 80.0–100.0)
MPV: 10.4 fL (ref 7.5–12.5)
Platelets: 462 10*3/uL — ABNORMAL HIGH (ref 140–400)
RBC: 3.94 10*6/uL (ref 3.80–5.10)
RDW: 14.3 % (ref 11.0–15.0)
WBC: 5.4 10*3/uL (ref 3.8–10.8)

## 2019-12-21 LAB — HEPATITIS B SURFACE ANTIGEN: Hepatitis B Surface Ag: NONREACTIVE

## 2019-12-21 LAB — COMPREHENSIVE METABOLIC PANEL
AG Ratio: 1.2 (calc) (ref 1.0–2.5)
ALT: 7 U/L (ref 6–29)
AST: 12 U/L (ref 10–30)
Albumin: 3.5 g/dL — ABNORMAL LOW (ref 3.6–5.1)
Alkaline phosphatase (APISO): 28 U/L — ABNORMAL LOW (ref 31–125)
BUN: 8 mg/dL (ref 7–25)
CO2: 22 mmol/L (ref 20–32)
Calcium: 8.4 mg/dL — ABNORMAL LOW (ref 8.6–10.2)
Chloride: 106 mmol/L (ref 98–110)
Creat: 0.73 mg/dL (ref 0.50–1.10)
Globulin: 3 g/dL (calc) (ref 1.9–3.7)
Glucose, Bld: 85 mg/dL (ref 65–99)
Potassium: 4.2 mmol/L (ref 3.5–5.3)
Sodium: 139 mmol/L (ref 135–146)
Total Bilirubin: 0.3 mg/dL (ref 0.2–1.2)
Total Protein: 6.5 g/dL (ref 6.1–8.1)

## 2019-12-21 LAB — URINE CULTURE
MICRO NUMBER:: 11268788
SPECIMEN QUALITY:: ADEQUATE

## 2019-12-21 LAB — URINALYSIS, COMPLETE W/RFL CULTURE
Bilirubin Urine: NEGATIVE
Glucose, UA: NEGATIVE
Hgb urine dipstick: NEGATIVE
Hyaline Cast: NONE SEEN /LPF
Ketones, ur: NEGATIVE
Leukocyte Esterase: NEGATIVE
Nitrites, Initial: NEGATIVE
RBC / HPF: NONE SEEN /HPF (ref 0–2)
Specific Gravity, Urine: 1.025 (ref 1.001–1.03)
pH: 6 (ref 5.0–8.0)

## 2019-12-21 LAB — CULTURE INDICATED

## 2019-12-21 LAB — LIPID PANEL
Cholesterol: 166 mg/dL (ref <200)
HDL: 58 mg/dL (ref >=50)
LDL Cholesterol (Calc): 90 mg/dL (calc)
Non-HDL Cholesterol (Calc): 108 mg/dL (calc) (ref <130)
Total CHOL/HDL Ratio: 2.9 (calc) (ref <5.0)
Triglycerides: 88 mg/dL (ref <150)

## 2019-12-21 LAB — VITAMIN D 25 HYDROXY (VIT D DEFICIENCY, FRACTURES): Vit D, 25-Hydroxy: 46 ng/mL (ref 30–100)

## 2019-12-21 LAB — PAP THINPREP ASCUS RFLX HPV RFLX TYPE
C. trachomatis RNA, TMA: NOT DETECTED
N. gonorrhoeae RNA, TMA: NOT DETECTED

## 2019-12-21 LAB — TSH: TSH: 2.37 mIU/L

## 2019-12-21 LAB — PROLACTIN: Prolactin: 11.1 ng/mL

## 2019-12-21 LAB — HEPATITIS C ANTIBODY
Hepatitis C Ab: NONREACTIVE
SIGNAL TO CUT-OFF: 0.01 (ref <1.00)

## 2019-12-21 LAB — RPR: RPR Ser Ql: NONREACTIVE

## 2019-12-25 ENCOUNTER — Telehealth: Payer: Self-pay

## 2019-12-25 NOTE — Telephone Encounter (Signed)
Patient inquiring about CMET and CBC results. I let her know about the others that you send result notes on and the negative ones.

## 2019-12-25 NOTE — Telephone Encounter (Signed)
Spoke with patient and informed her. °

## 2019-12-25 NOTE — Telephone Encounter (Signed)
CBC continued anemia with Hb at 9.5.  Platelets mildly increased at 462 (improved c/w 12/2018 and 03/2019)  CMET  Ca++ mildly low at 8.4.  Increase Ca++ intake to 1200 mg daily.  No significant abnormality otherwise.

## 2020-01-03 ENCOUNTER — Ambulatory Visit (INDEPENDENT_AMBULATORY_CARE_PROVIDER_SITE_OTHER): Payer: BC Managed Care – PPO

## 2020-01-03 ENCOUNTER — Ambulatory Visit (INDEPENDENT_AMBULATORY_CARE_PROVIDER_SITE_OTHER): Payer: BC Managed Care – PPO | Admitting: Obstetrics & Gynecology

## 2020-01-03 ENCOUNTER — Other Ambulatory Visit: Payer: Self-pay

## 2020-01-03 DIAGNOSIS — D219 Benign neoplasm of connective and other soft tissue, unspecified: Secondary | ICD-10-CM | POA: Diagnosis not present

## 2020-01-03 DIAGNOSIS — N920 Excessive and frequent menstruation with regular cycle: Secondary | ICD-10-CM

## 2020-01-03 DIAGNOSIS — D500 Iron deficiency anemia secondary to blood loss (chronic): Secondary | ICD-10-CM

## 2020-01-03 NOTE — Progress Notes (Signed)
    Alyssa [name] October 11, 1975 [id number]        44 y.o.  [name]   RP: Menorrhagia with Fibroids for Pelvic US  HPI: Anemia with Hb at 9.5 on 12/20/2019.  At that visit we noted:  Heavy menses even on Xulane patches.  No breakthrough bleeding.  No pelvic pain.  Complains of a pretty liquid abundant frequent vaginal discharge.  Urine and bowel movements normal.  Body mass index 30.82.   OB History  Gravida Para Term Preterm AB Living  1       1 0  SAB IAB Ectopic Multiple Live Births               # Outcome Date GA Lbr Len/2nd Weight Sex Delivery Anes PTL Lv  1 AB             Past medical history,surgical history, problem list, medications, allergies, family history and social history were all reviewed and documented in the EPIC chart.   Directed ROS with pertinent positives and negatives documented in the history of present illness/assessment and plan.  Exam:  There were no vitals filed for this visit. General appearance:  Normal  Pelvic US today: T/V images.  Anteverted uterus enlarged with multiple fibroids.  The overall uterine size is measured at 14.73 x 8.13 x 7.09 cm.  The largest fibroid is measured at 6.7 cm and is subserosal.  The other fibroids are intramural and subserosal measured at 6 cm, 4.9 cm, 5 cm, 2.3 cm and 2.3 cm.  The endometrial lining is measured at 7.43 mm and is distorted by the fibroids.  Both ovaries are normal in size with a follicular pattern.  No adnexal mass seen.  Mild clear fluid in the posterior cul-de-sac measured at 6.5 x 3.5 cm.  Hb 9.5 on 12/20/2019   Assessment/Plan:  44 y.o. [name]   1. Fibroids Menorrhagia with secondary anemia, hemoglobin 9.5 on December 20, 2019.  Pelvic ultrasound findings thoroughly reviewed with patient today.  Patient has an overall uterine size measured at 14.73 x 8.13 x 7.09 cm.  The fibroids (6) are subserosal and intramural from 6.7 to 2.3 cm.  The endometrial line is at 7.43 mm and distorted by fibroids.   Ovaries are normal.  Given the patient's heavy menses with secondary anemia is not control by the contraceptive patch, decision to proceed with a surgical approach.  Patient prefers conservation of her uterus.  Will schedule a Sonata procedure with hysteroscopy and D&C.  Surgery reviewed with patient.  Will follow for a preop visit.  2. Menorrhagia with regular cycle As above.  [name] MD, 8:53 AM 01/03/2020

## 2020-01-07 ENCOUNTER — Encounter: Payer: Self-pay | Admitting: Obstetrics & Gynecology

## 2020-01-07 ENCOUNTER — Telehealth: Payer: Self-pay

## 2020-01-07 NOTE — Telephone Encounter (Signed)
Patient is candidate for Sonata and I am working towards scheduling her.  She asked me to ask Dr. Marguerita Merles a question .SHe said she will be 44 yo this week and wondered if she might should have a hysterectomy instead of this procedure.  I did explain that hysterectomy is major surgery, longer recovery, longer anesthesia and this Sonata procedure could potientially take care of the problem.  She did want me to check with Dr. Marguerita Merles.

## 2020-01-08 NOTE — Telephone Encounter (Signed)
I reinforce what you told her.  We may be able to avoid her a hysterectomy using the Sonata instrument which is a lower risk procedure with faster recovery.

## 2020-01-08 NOTE — Telephone Encounter (Signed)
Spoke with patient and advised

## 2020-01-14 ENCOUNTER — Encounter: Payer: Self-pay | Admitting: Anesthesiology

## 2020-01-21 ENCOUNTER — Telehealth: Payer: Self-pay

## 2020-01-21 NOTE — Telephone Encounter (Addendum)
Alyssa [name] called patient to schedule pre op visit as requested by Dr. Mackey Birchwood.  Patient does not want to schedule an appointment stating she has no questions.   She was willing to schedule a video visit for pre op if that is okay with you.

## 2020-01-22 NOTE — Telephone Encounter (Signed)
Patient informed. 

## 2020-01-22 NOTE — Telephone Encounter (Signed)
Ok, I thought she said yes to the video visit.  I will talk with her the morning of surgery.

## 2020-01-22 NOTE — Telephone Encounter (Signed)
Yes, agree with a video visit.

## 2020-01-22 NOTE — Telephone Encounter (Signed)
Patient does not want to have pre op visit.  She does not want to have to pay $10 copayment.  SHe is persistant about not coming for pre op visit stating she has no questions and you told her everything.  She declined video visit as she does not have My Chart. (Alyssa [name] thought that might be a good solution since she did not want to come in.)

## 2020-01-27 ENCOUNTER — Telehealth: Payer: Self-pay | Admitting: Obstetrics and Gynecology

## 2020-01-27 ENCOUNTER — Encounter: Payer: Self-pay | Admitting: Obstetrics and Gynecology

## 2020-01-27 DIAGNOSIS — R110 Nausea: Secondary | ICD-10-CM

## 2020-01-27 DIAGNOSIS — N939 Abnormal uterine and vaginal bleeding, unspecified: Secondary | ICD-10-CM

## 2020-01-27 MED ORDER — NORGESTIMATE-ETH ESTRADIOL 0.25-35 MG-MCG PO TABS: ORAL_TABLET | ORAL | 1 refills | Status: DC

## 2020-01-27 MED ORDER — ONDANSETRON HCL 4 MG PO TABS: 4.0000 mg | ORAL_TABLET | Freq: Three times a day (TID) | ORAL | 0 refills | Status: DC | PRN

## 2020-01-27 MED ORDER — IBUPROFEN 800 MG PO TABS: 800.0000 mg | ORAL_TABLET | Freq: Three times a day (TID) | ORAL | 0 refills | Status: DC | PRN

## 2020-01-27 NOTE — Telephone Encounter (Signed)
Patient called answering service. She has had a period for 6 weeks. Per prior conversation with Dr Dellis Filbert, she continued the birth control patch in the 4th week (this last week). Bleeding this week has been better than previously but she started passing clots and having intense cramping since yesterday. Cramping has made it hard to have an appetite. Changed pads 3 times today so far and yesterday all day. Not taking anything for pain currently- but took 400mg  ibuprofen on Thursday.   Discussed stopping the birth control patch and will start an OCP taper instead to help decrease bleeding. Sprintec prescribed. Instructed to take 3 pills today x3 days, 2 pills for 2 days then one pill after. Will also prescribe zofran 4mg  q8 hrs prn for nausea and 800 mg ibuprofen q8 hrs prn for cramping.   Patient expressed understanding and will call if symptoms worsen. She has hysteroscopy scheduled with Dr Dellis Filbert later this month.   Alyssa Folds, MD

## 2020-01-31 ENCOUNTER — Other Ambulatory Visit (HOSPITAL_COMMUNITY): Payer: BC Managed Care – PPO

## 2020-02-01 ENCOUNTER — Encounter (HOSPITAL_COMMUNITY): Payer: Self-pay | Admitting: Anesthesiology

## 2020-02-01 ENCOUNTER — Encounter (HOSPITAL_BASED_OUTPATIENT_CLINIC_OR_DEPARTMENT_OTHER): Payer: Self-pay | Admitting: Obstetrics & Gynecology

## 2020-02-01 ENCOUNTER — Other Ambulatory Visit (HOSPITAL_COMMUNITY)
Admission: RE | Admit: 2020-02-01 | Discharge: 2020-02-01 | Disposition: A | Discharge status: Home / Self Care | Payer: BC Managed Care – PPO | Source: Ambulatory Visit | Attending: Obstetrics & Gynecology | Admitting: Obstetrics & Gynecology

## 2020-02-01 ENCOUNTER — Other Ambulatory Visit: Payer: Self-pay

## 2020-02-01 DIAGNOSIS — Z01812 Encounter for preprocedural laboratory examination: Secondary | ICD-10-CM | POA: Insufficient documentation

## 2020-02-01 DIAGNOSIS — U071 COVID-19: Secondary | ICD-10-CM | POA: Insufficient documentation

## 2020-02-01 DIAGNOSIS — Z8616 Personal history of COVID-19: Secondary | ICD-10-CM

## 2020-02-01 HISTORY — DX: Personal history of COVID-19: Z86.16

## 2020-02-01 NOTE — Anesthesia Preprocedure Evaluation (Deleted)
Anesthesia Evaluation    Reviewed: Allergy & Precautions, Patient's Chart, lab work & pertinent test results  Airway        Dental   Pulmonary neg pulmonary ROS,           Cardiovascular negative cardio ROS       Neuro/Psych negative neurological ROS  negative psych ROS   GI/Hepatic negative GI ROS, Neg liver ROS,   Endo/Other  negative endocrine ROS  Renal/GU negative Renal ROS     Musculoskeletal negative musculoskeletal ROS (+)   Abdominal   Peds  Hematology negative hematology ROS (+)   Anesthesia Other Findings   Reproductive/Obstetrics                             Anesthesia Physical Anesthesia Plan  ASA: II  Anesthesia Plan: General   Post-op Pain Management:    Induction:   PONV Risk Score and Plan: 4 or greater and Ondansetron, Dexamethasone, Midazolam and Scopolamine patch - Pre-op  Airway Management Planned: LMA  Additional Equipment: None  Intra-op Plan:   Post-operative Plan: Extubation in OR  Informed Consent:   Plan Discussed with: CRNA  Anesthesia Plan Comments:         Anesthesia Quick Evaluation

## 2020-02-01 NOTE — Progress Notes (Signed)
Spoke w/ via phone for pre-op interview--- PT Lab needs dos---- CBC, Urine preg              Lab results------ no COVID test ------ done 02-01-2020 @ 2751 Arrive at -------  0630 on 02-04-2020 NPO after MN NO Solid Food.  Clear liquids from MN until--- Medications to take morning of surgery ----- NONE Diabetic medication ----- n/a Patient Special Instructions ----- n/a Pre-Op special Istructions ----- n/a Patient verbalized understanding of instructions that were given at this phone interview. Patient denies shortness of breath, chest pain, fever, cough at this phone interview.

## 2020-02-02 LAB — SARS CORONAVIRUS 2 (TAT 6-24 HRS): SARS Coronavirus 2: POSITIVE — AB

## 2020-02-02 NOTE — Progress Notes (Signed)
Dr. Pola Corn called back and + covid results relayed.

## 2020-02-02 NOTE — Progress Notes (Signed)
Called Veronica from the on-call service for Dr. Dellis Filbert with + covid results for this pt. The pt is to have surgery on Mon 02/04/20 at [facility] at 8:15am. The pt has not been notified, as the surgeon may need to answer pertinent questions.  These are the current guidelines:  Positive Results for:  Asymptomatic: Procedure postponed and quarantined for 10 days. Symptomatic: Procedure postponed and quarantined for 14 days. Hospitalized with Covid: postponed and quarantined  for 21 days. Immunocompromised: Procedure postponed and quarantine for 20 days.  The pt will not be retested for 90 days from the + result.

## 2020-02-04 ENCOUNTER — Ambulatory Visit (HOSPITAL_BASED_OUTPATIENT_CLINIC_OR_DEPARTMENT_OTHER)
Admission: RE | Admit: 2020-02-04 | Payer: BC Managed Care – PPO | Source: Home / Self Care | Admitting: Obstetrics & Gynecology

## 2020-02-04 HISTORY — DX: Iron deficiency anemia secondary to blood loss (chronic): D50.0

## 2020-02-04 HISTORY — DX: Dermatitis, unspecified: L30.9

## 2020-02-04 HISTORY — DX: Personal history of other infectious and parasitic diseases: Z86.19

## 2020-02-04 HISTORY — DX: Leiomyoma of uterus, unspecified: D25.9

## 2020-02-04 HISTORY — DX: Excessive and frequent menstruation with regular cycle: N92.0

## 2020-02-04 SURGERY — DILATATION AND CURETTAGE /HYSTEROSCOPY
Anesthesia: General

## 2020-02-05 ENCOUNTER — Telehealth: Payer: Self-pay

## 2020-02-05 NOTE — Telephone Encounter (Signed)
Patient called because she tested pos for Covid at her pre op testing and surgery was cancelled. I told her I will call her as soon as I can to reschedule surgery but office was closed yesterday and delayed opening today and I will need to wait until we can get the incoming patients handled to start back scheduling surgery.

## 2020-02-07 NOTE — Telephone Encounter (Signed)
I called and spoke with patient.  She is asymptomatic and per Preadmitting can be r/s after 10 days if remains asymptomatic. I reviewed this with her and that at if any point prior to surgery date she becomes symptomatic she needs to let us know.  Surgery r/s for 02/15/20 at 11am at [facility]  Patient informed/instructed.

## 2020-02-11 ENCOUNTER — Encounter (HOSPITAL_BASED_OUTPATIENT_CLINIC_OR_DEPARTMENT_OTHER): Payer: Self-pay | Admitting: Obstetrics & Gynecology

## 2020-02-11 ENCOUNTER — Other Ambulatory Visit: Payer: Self-pay

## 2020-02-11 NOTE — Progress Notes (Signed)
Spoke w/ via phone for pre-op interview--- PT Lab needs dos---- CBC, Urine preg              Lab results------ no COVID test ------ positive test done  02-01-2020 result in epic,  Per pt asymptomatic Arrive at -------  0900 on 02-15-2020 NPO after MN NO Solid Food.  Clear liquids from MN until--- 0800 Medications to take morning of surgery ----- NONE Diabetic medication ----- n/a Patient Special Instructions ----- n/a Pre-Op special Istructions ----- n/a Patient verbalized understanding of instructions that were given at this phone interview. Patient denies shortness of breath, chest pain, fever, cough at this phone interview.

## 2020-02-14 NOTE — Progress Notes (Signed)
Spoke with patient this morning to verify her arrival time of 0700. Patient states she may be here a little earlier d/t transportation. Also, called Dr. Assunta Curtis office for orders, left message on surgery scheduler's voicemail.

## 2020-02-15 ENCOUNTER — Ambulatory Visit (HOSPITAL_BASED_OUTPATIENT_CLINIC_OR_DEPARTMENT_OTHER): Payer: BC Managed Care – PPO | Admitting: Certified Registered Nurse Anesthetist

## 2020-02-15 ENCOUNTER — Ambulatory Visit (HOSPITAL_BASED_OUTPATIENT_CLINIC_OR_DEPARTMENT_OTHER)
Admission: RE | Admit: 2020-02-15 | Discharge: 2020-02-15 | Disposition: A | Discharge status: Home / Self Care | Payer: BC Managed Care – PPO | Attending: Obstetrics & Gynecology | Admitting: Obstetrics & Gynecology

## 2020-02-15 ENCOUNTER — Encounter (HOSPITAL_BASED_OUTPATIENT_CLINIC_OR_DEPARTMENT_OTHER): Payer: Self-pay | Admitting: Obstetrics & Gynecology

## 2020-02-15 ENCOUNTER — Encounter (HOSPITAL_BASED_OUTPATIENT_CLINIC_OR_DEPARTMENT_OTHER)
Admission: RE | Disposition: A | Discharge status: Home / Self Care | Payer: Self-pay | Source: Home / Self Care | Attending: Obstetrics & Gynecology

## 2020-02-15 DIAGNOSIS — D500 Iron deficiency anemia secondary to blood loss (chronic): Secondary | ICD-10-CM | POA: Diagnosis not present

## 2020-02-15 DIAGNOSIS — D251 Intramural leiomyoma of uterus: Secondary | ICD-10-CM | POA: Insufficient documentation

## 2020-02-15 DIAGNOSIS — D252 Subserosal leiomyoma of uterus: Secondary | ICD-10-CM | POA: Insufficient documentation

## 2020-02-15 DIAGNOSIS — D649 Anemia, unspecified: Secondary | ICD-10-CM | POA: Diagnosis not present

## 2020-02-15 DIAGNOSIS — Z8616 Personal history of COVID-19: Secondary | ICD-10-CM | POA: Insufficient documentation

## 2020-02-15 DIAGNOSIS — N920 Excessive and frequent menstruation with regular cycle: Secondary | ICD-10-CM | POA: Diagnosis present

## 2020-02-15 DIAGNOSIS — D259 Leiomyoma of uterus, unspecified: Secondary | ICD-10-CM | POA: Diagnosis present

## 2020-02-15 HISTORY — PX: DILATATION AND CURETTAGE/HYSTEROSCOPY WITH MINERVA: SHX6851

## 2020-02-15 LAB — TYPE AND SCREEN
ABO/RH(D): AB POS
Antibody Screen: NEGATIVE

## 2020-02-15 LAB — CBC
HCT: 30.8 % — ABNORMAL LOW (ref 36.0–46.0)
Hemoglobin: 9.8 g/dL — ABNORMAL LOW (ref 12.0–15.0)
MCH: 24.3 pg — ABNORMAL LOW (ref 26.0–34.0)
MCHC: 31.8 g/dL (ref 30.0–36.0)
MCV: 76.2 fL — ABNORMAL LOW (ref 80.0–100.0)
Platelets: 371 10*3/uL (ref 150–400)
RBC: 4.04 MIL/uL (ref 3.87–5.11)
RDW: 17.7 % — ABNORMAL HIGH (ref 11.5–15.5)
WBC: 5.6 10*3/uL (ref 4.0–10.5)
nRBC: 0 % (ref 0.0–0.2)

## 2020-02-15 LAB — ABO/RH: ABO/RH(D): AB POS

## 2020-02-15 LAB — POCT PREGNANCY, URINE: Preg Test, Ur: NEGATIVE

## 2020-02-15 SURGERY — (RADIOFREQUENCY) ABLATION
Anesthesia: General

## 2020-02-15 MED ORDER — LABETALOL HCL 5 MG/ML IV SOLN: 5.0000 mg | INTRAVENOUS | Status: DC | PRN

## 2020-02-15 MED ORDER — POVIDONE-IODINE 10 % EX SWAB: 2.0000 "application " | Freq: Once | CUTANEOUS | Status: DC

## 2020-02-15 MED ORDER — PROPOFOL 10 MG/ML IV BOLUS
INTRAVENOUS | Status: AC
  Filled 2020-02-15: qty 20

## 2020-02-15 MED ORDER — OXYCODONE HCL 5 MG PO TABS
ORAL_TABLET | ORAL | Status: AC
  Filled 2020-02-15: qty 1

## 2020-02-15 MED ORDER — FENTANYL CITRATE (PF) 100 MCG/2ML IJ SOLN
INTRAMUSCULAR | Status: AC
  Filled 2020-02-15: qty 2

## 2020-02-15 MED ORDER — LIDOCAINE HCL (PF) 2 % IJ SOLN
INTRAMUSCULAR | Status: AC
  Filled 2020-02-15: qty 5

## 2020-02-15 MED ORDER — PROPOFOL 10 MG/ML IV BOLUS
INTRAVENOUS | Status: DC | PRN
  Administered 2020-02-15: 200 mg via INTRAVENOUS

## 2020-02-15 MED ORDER — LABETALOL HCL 5 MG/ML IV SOLN
5.0000 mg | INTRAVENOUS | Status: DC | PRN
Start: 2020-02-15 — End: 2020-02-15

## 2020-02-15 MED ORDER — CEFAZOLIN SODIUM-DEXTROSE 2-4 GM/100ML-% IV SOLN
2.0000 g | INTRAVENOUS | Status: AC
  Administered 2020-02-15: 2 g via INTRAVENOUS

## 2020-02-15 MED ORDER — HYDRALAZINE HCL 20 MG/ML IJ SOLN
10.0000 mg | INTRAMUSCULAR | Status: DC | PRN
Start: 2020-02-15 — End: 2020-02-15
  Administered 2020-02-15 (×2): 10 mg via INTRAVENOUS

## 2020-02-15 MED ORDER — ONDANSETRON HCL 4 MG/2ML IJ SOLN
INTRAMUSCULAR | Status: DC | PRN
  Administered 2020-02-15: 4 mg via INTRAVENOUS

## 2020-02-15 MED ORDER — HYDRALAZINE HCL 20 MG/ML IJ SOLN
INTRAMUSCULAR | Status: AC
  Filled 2020-02-15: qty 1

## 2020-02-15 MED ORDER — OXYCODONE HCL 5 MG/5ML PO SOLN: 5.0000 mg | Freq: Once | ORAL | Status: AC | PRN

## 2020-02-15 MED ORDER — SODIUM CHLORIDE 0.9 % IR SOLN
Status: DC | PRN
  Administered 2020-02-15: 3000 mL

## 2020-02-15 MED ORDER — LACTATED RINGERS IV SOLN: INTRAVENOUS | Status: DC

## 2020-02-15 MED ORDER — FENTANYL CITRATE (PF) 100 MCG/2ML IJ SOLN
25.0000 ug | INTRAMUSCULAR | Status: DC | PRN
  Administered 2020-02-15 (×2): 50 ug via INTRAVENOUS

## 2020-02-15 MED ORDER — KETOROLAC TROMETHAMINE 30 MG/ML IJ SOLN
30.0000 mg | Freq: Once | INTRAMUSCULAR | Status: AC
  Administered 2020-02-15: 30 mg via INTRAVENOUS

## 2020-02-15 MED ORDER — OXYCODONE HCL 5 MG PO TABS
5.0000 mg | ORAL_TABLET | Freq: Once | ORAL | Status: AC | PRN
  Administered 2020-02-15: 5 mg via ORAL

## 2020-02-15 MED ORDER — CEFAZOLIN SODIUM-DEXTROSE 2-4 GM/100ML-% IV SOLN
INTRAVENOUS | Status: AC
  Filled 2020-02-15: qty 100

## 2020-02-15 MED ORDER — STERILE WATER FOR INJECTION IJ SOLN
INTRAMUSCULAR | Status: DC | PRN
  Administered 2020-02-15: 60 mL via SURGICAL_CAVITY

## 2020-02-15 MED ORDER — DEXAMETHASONE SODIUM PHOSPHATE 10 MG/ML IJ SOLN
INTRAMUSCULAR | Status: DC | PRN
  Administered 2020-02-15: 10 mg via INTRAVENOUS

## 2020-02-15 MED ORDER — ONDANSETRON HCL 4 MG/2ML IJ SOLN
INTRAMUSCULAR | Status: AC
  Filled 2020-02-15: qty 2

## 2020-02-15 MED ORDER — MIDAZOLAM HCL 2 MG/2ML IJ SOLN
INTRAMUSCULAR | Status: AC
  Filled 2020-02-15: qty 2

## 2020-02-15 MED ORDER — AMISULPRIDE (ANTIEMETIC) 5 MG/2ML IV SOLN: 10.0000 mg | Freq: Once | INTRAVENOUS | Status: DC | PRN

## 2020-02-15 MED ORDER — KETOROLAC TROMETHAMINE 30 MG/ML IJ SOLN
INTRAMUSCULAR | Status: AC
  Filled 2020-02-15: qty 1

## 2020-02-15 MED ORDER — CHLOROPROCAINE HCL 1 % IJ SOLN
INTRAMUSCULAR | Status: DC | PRN
  Administered 2020-02-15: 20 mL

## 2020-02-15 MED ORDER — PROMETHAZINE HCL 25 MG/ML IJ SOLN: 6.2500 mg | INTRAMUSCULAR | Status: DC | PRN

## 2020-02-15 MED ORDER — LIDOCAINE 2% (20 MG/ML) 5 ML SYRINGE
INTRAMUSCULAR | Status: DC | PRN
  Administered 2020-02-15: 80 mg via INTRAVENOUS

## 2020-02-15 MED ORDER — FENTANYL CITRATE (PF) 250 MCG/5ML IJ SOLN
INTRAMUSCULAR | Status: DC | PRN
  Administered 2020-02-15 (×8): 25 ug via INTRAVENOUS

## 2020-02-15 MED ORDER — DEXAMETHASONE SODIUM PHOSPHATE 10 MG/ML IJ SOLN
INTRAMUSCULAR | Status: AC
  Filled 2020-02-15: qty 1

## 2020-02-15 MED ORDER — MIDAZOLAM HCL 5 MG/5ML IJ SOLN
INTRAMUSCULAR | Status: DC | PRN
  Administered 2020-02-15: 2 mg via INTRAVENOUS

## 2020-02-15 SURGICAL SUPPLY — 20 items
BIPOLAR CUTTING LOOP 21FR (ELECTRODE)
CATH ROBINSON RED A/P 16FR (CATHETERS) ×2 IMPLANT
COVER WAND RF STERILE (DRAPES) ×2 IMPLANT
DILATOR CANAL MILEX (MISCELLANEOUS) IMPLANT
ELECT DISPERSIVE SONATA (MISCELLANEOUS) ×2 IMPLANT
ELECT REM PT RETURN 9FT ADLT (ELECTROSURGICAL)
ELECTRODE REM PT RTRN 9FT ADLT (ELECTROSURGICAL) IMPLANT
GAUZE 4X4 16PLY RFD (DISPOSABLE) ×2 IMPLANT
GLOVE SURG ENC MOIS LTX SZ6.5 (GLOVE) ×2 IMPLANT
GLOVE SURG UNDER POLY LF SZ7 (GLOVE) ×2 IMPLANT
GOWN STRL REUS W/TWL LRG LVL3 (GOWN DISPOSABLE) ×2 IMPLANT
HANDPIECE RFA SONATA (MISCELLANEOUS) ×1 IMPLANT
IV NS IRRIG 3000ML ARTHROMATIC (IV SOLUTION) ×2 IMPLANT
KIT PROCEDURE FLUENT (KITS) ×2 IMPLANT
KIT TURNOVER CYSTO (KITS) ×2 IMPLANT
LOOP CUTTING BIPOLAR 21FR (ELECTRODE) IMPLANT
PACK VAGINAL MINOR WOMEN LF (CUSTOM PROCEDURE TRAY) ×2 IMPLANT
PAD OB MATERNITY 4.3X12.25 (PERSONAL CARE ITEMS) ×2 IMPLANT
TOWEL OR 17X26 10 PK STRL BLUE (TOWEL DISPOSABLE) ×4 IMPLANT
WATER STERILE IRR 500ML POUR (IV SOLUTION) ×2 IMPLANT

## 2020-02-15 NOTE — Anesthesia Preprocedure Evaluation (Signed)
Anesthesia Evaluation  Patient identified by MRN, date of birth, ID band Patient awake    Reviewed: Allergy & Precautions, NPO status , Patient's Chart, lab work & pertinent test results  Airway Mallampati: I  TM Distance: >3 FB Neck ROM: Full    Dental  (+) Partial Lower   Pulmonary  COVID + 02/01/20   Pulmonary exam normal breath sounds clear to auscultation       Cardiovascular Exercise Tolerance: Good hypertension (no formal diagnosis), Normal cardiovascular exam Rhythm:Regular Rate:Normal     Neuro/Psych negative neurological ROS  negative psych ROS   GI/Hepatic negative GI ROS, Neg liver ROS,   Endo/Other  negative endocrine ROS  Renal/GU negative Renal ROS  negative genitourinary   Musculoskeletal negative musculoskeletal ROS (+)   Abdominal   Peds negative pediatric ROS (+)  Hematology  (+) anemia ,   Anesthesia Other Findings   Reproductive/Obstetrics Cervical dysplasia                             Anesthesia Physical Anesthesia Plan  ASA: III  Anesthesia Plan:    Post-op Pain Management:    Induction:   PONV Risk Score and Plan: Ondansetron, Dexamethasone, Midazolam, Scopolamine patch - Pre-op and Treatment may vary due to age or medical condition  Airway Management Planned: LMA  Additional Equipment:   Intra-op Plan:   Post-operative Plan:   Informed Consent: I have reviewed the patients History and Physical, chart, labs and discussed the procedure including the risks, benefits and alternatives for the proposed anesthesia with the patient or authorized representative who has indicated his/her understanding and acceptance.       Plan Discussed with: CRNA, Anesthesiologist and Surgeon  Anesthesia Plan Comments: (BP significantly elevated. Patient has no diagnosis of HTN. Is currently asymptomatic (no headache, blurry vision, etc.). Had a thorough discussion  with patient that this is elevated more than "nervousness" and that we will do her procedure today but she will need to followup within 1 week with a PCP. Will treat perioperatively with IV anti-hypertensives. Norton Blizzard, MD  )        Anesthesia Quick Evaluation

## 2020-02-15 NOTE — Op Note (Signed)
Operative Note  02/15/2020  11:04 AM  PATIENT:  Alyssa [name]  45 y.o. female  PRE-OPERATIVE DIAGNOSIS:  multiple fibroids, menorrhagia, secondary anemia  POST-OPERATIVE DIAGNOSIS:  multiple fibroids, menorrhagia, secondary anemia  PROCEDURE:  Procedure(s): SONATA PROCEDURE, DILATATION AND CURETTAGE /HYSTEROSCOPY  SURGEON:  Surgeon(s): [name], MD  ANESTHESIA:   general with Laryngeal Mask  FINDINGS: Intramural fibroids with submucosal component, Intramural fibroids, Subserosal/Intramural fibroids and 1 pedunculated fibroid  DESCRIPTION OF OPERATION: Under general anesthesia with laryngeal mask, the patient is in lithotomy position.  She is prepped with Betadine on the suprapubic, vulvar and vaginal areas. The bladder is catheterized.  The patient is draped as usual.  Timeout is done.  The vaginal exam reveals a nodular enlarged uterus with fibroids measuring about 14 cm in diameter.  The speculum is inserted into the vagina and the anterior lip of the cervix is grasped with a tenaculum.  A paracervical block is done with Nesacaine 1% a total of 20 cc at 4 and 8:00.  The cervix is dilated with Pratt dilators up to #27 without difficulty.  The hysterometry was at 9.5 cm.  A diagnostic hysteroscopy is done confirming an enlarged cavity with a few fibroids partially submucosal.  Both ostia were well visualized.  No other lesion was present.  Pictures were taken.  The hysteroscope was removed.  We then proceeded with a systematic curettage of the intra uterine cavity with a sharp curette on all surfaces.  The curettings were sent to pathology.  This on the left device was then inserted in the intra uterine cavity.  The speculum was removed.  The entire uterus was scanned with the [facility] device.  The fibroids were mapped.  A large fibroid type II-V measuring 6.5 cm in diameter was localized at 10-12 o'clock.  2 ablations were done on that large fibroid.  The first ablation measured 3.6 x  2.8 cm and the second ablation measured 3.7 x 2.9 cm.  We then went to a type III fibroids measuring 3 cm in diameter at 2 o'clock position.  An ablation was performed measuring 2.4 x 1.7 cm.  We finally did a Sonata ablation on a third fibroid which was type IV measuring 3 cm in diameter at 2 o'clock position.  The ablation size was 3.5 x 2.5 cm.  We then scanning the uterus once more at the end to verify the ablated fibroids.  The [place] instrument was removed.  The tenaculum was removed from the cervix.  Hemostasis was adequate.  The patient tolerated the procedure well with no complication.  The patient was brought to recovery room in good and stable status.  ESTIMATED BLOOD LOSS: 50 mL Fluid deficit 690 cc  Intake/Output Summary (Last 24 hours) at 02/15/2020 1104 Last data filed at 02/15/2020 1054 Gross per 24 hour  Intake 1000 ml  Output 250 ml  Net 750 ml     BLOOD ADMINISTERED:none   LOCAL MEDICATIONS USED:  Nesacaine 1% 20 cc Paracervical block  SPECIMEN:  Source of Specimen:  Endometrial curettings  DISPOSITION OF SPECIMEN:  PATHOLOGY  COUNTS:  YES  PLAN OF CARE: Transfer to PACU  Marie-Lyne LavoieMD11:04 AM

## 2020-02-15 NOTE — H&P (Signed)
DRUANNE BOSQUES is an 45 y.o. female  [name]   RP: Menorrhagia with Fibroids for HSC/D+C/Sonata procedure  HPI: Anemia with Hb at 9.5 on 12/20/2019.  At that visit we noted:  Heavy menses even on Xulane patches.No breakthrough bleeding. No pelvic pain. Complains of a pretty liquid abundant frequent vaginal discharge. Urine and bowel movements normal. Body mass index 30.82.  Pertinent Gynecological History: Menses: flow is excessive with use of many pads or tampons on heaviest days Contraception: condoms Blood transfusions: none Last mammogram: normal  Last pap: normal   Menstrual History: Patient's last menstrual period was 01/24/2020 (approximate).    Past Medical History:  Diagnosis Date  . Anemia due to blood loss, chronic    due to menorrhagia  . Eczema   . History of 2019 novel coronavirus disease (COVID-19) 02/01/2020   pt tested coivd positive , result in epic, which was done for surgery.  per pt asymptomatic  . History of abnormal cervical Pap smear 10/2013   HVP  . 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 History of herpes genitalis   . History of sexually transmitted disease 2006  . Menorrhagia   . Uterine fibroid     Past Surgical History:  Procedure Laterality Date  . CYSTOSCOPY WITH RETROGRADE PYELOGRAM, URETEROSCOPY AND STENT PLACEMENT  03-22-2005  @[facility]   . EXTRACORPOREAL SHOCK WAVE LITHOTRIPSY  03/2005  . GYNECOLOGIC CRYOSURGERY  2005  . HYSTEROSCOPY WITH RESECTOSCOPE  11-04-2008  @[facility]    MYOMECTOMY  . MANDIBLE SURGERY Bilateral 01-30-2002  @[facility]    REMOVAL CYST'S BILATERAL MANDIBLE AND FOUR TEETH EXTRACTION'S  . UMBILICAL HERNIA REPAIR  10-01-1999  @[facility]     Family History  Problem Relation Age of Onset  . Diabetes Mother   . Hypertension Mother   . Kidney failure Mother   . Breast cancer Maternal Aunt        Age 53's  . Diabetes Maternal Grandmother   . Hypertension Maternal Grandmother     Social History:  reports that she  has never smoked. She has never used smokeless tobacco. She reports previous alcohol use. She reports that she does not use drugs.  Allergies: No Known Allergies  Medications Prior to Admission  Medication Sig Dispense Refill Last Dose  . APPLE CIDER VINEGAR PO Take by mouth. GOLI brand chews   02/14/2020 at Unknown time  . Ca Phosphate-Cholecalciferol (CALCIUM 500 + D3) 250-500 MG-UNIT CHEW Chew by mouth daily.   02/14/2020 at Unknown time  . ELDERBERRY PO Take by mouth. chews   02/14/2020 at Unknown time  . ferrous sulfate 325 (65 FE) MG EC tablet Take 325 mg by mouth daily.   More than a month at Unknown time  . ibuprofen (ADVIL) 800 MG tablet Take 1 tablet (800 mg total) by mouth every 8 (eight) hours as needed. (Patient not taking: No sig reported) 30 tablet 0 More than a month at Unknown time  . norelgestromin-ethinyl estradiol [name]) 150-35 MCG/24HR transdermal patch APPLY 1 PATCH ON SKIN ONCE WEEKLY, CONTINUOUS USE FOR MENORRHAGIA 12 patch 4 02/10/2020  . norgestimate-ethinyl estradiol (SPRINTEC 28) 0.25-35 MG-MCG tablet Take 3 pills daily for 3 days, then 2 pills daily for 2 days, then 1 pill daily until pack is completed. (Patient not taking: No sig reported) 28 tablet 1 Unknown at Unknown time  . ondansetron (ZOFRAN) 4 MG tablet Take 1 tablet (4 mg total) by mouth every 8 (eight) hours as needed for nausea or vomiting.  10 tablet 0 More than a month at Unknown time    REVIEW OF SYSTEMS: A ROS was performed and pertinent positives and negatives are included in the history.  GENERAL: No fevers or chills. HEENT: No change in vision, no earache, sore throat or sinus congestion. NECK: No pain or stiffness. CARDIOVASCULAR: No chest pain or pressure. No palpitations. PULMONARY: No shortness of breath, cough or wheeze. GASTROINTESTINAL: No abdominal pain, nausea, vomiting or diarrhea, melena or bright red blood per rectum. GENITOURINARY: No urinary frequency, urgency, hesitancy or dysuria.  MUSCULOSKELETAL: No joint or muscle pain, no back pain, no recent trauma. DERMATOLOGIC: No rash, no itching, no lesions. ENDOCRINE: No polyuria, polydipsia, no heat or cold intolerance. No recent change in weight. HEMATOLOGICAL: No anemia or easy bruising or bleeding. NEUROLOGIC: No headache, seizures, numbness, tingling or weakness. PSYCHIATRIC: No depression, no loss of interest in normal activity or change in sleep pattern.     Blood pressure (!) 157/112, pulse 88, temperature 97.7 F (36.5 C), temperature source Oral, resp. rate 17, height 5\' 5"  (1.651 m), weight 80.2 kg, last menstrual period 01/24/2020, SpO2 100 %.  Physical Exam:  See office notes   Results for orders placed or performed during the hospital encounter of 02/15/20 (from the past 24 hour(s))  Pregnancy, urine POC     Status: None   Collection Time: 02/15/20  6:31 AM  Result Value Ref Range   Preg Test, Ur NEGATIVE NEGATIVE  CBC     Status: Abnormal   Collection Time: 02/15/20  7:20 AM  Result Value Ref Range   WBC 5.6 4.0 - 10.5 K/uL   RBC 4.04 3.87 - 5.11 MIL/uL   Hemoglobin 9.8 (L) 12.0 - 15.0 g/dL   HCT 30.8 (L) 36.0 - 46.0 %   MCV 76.2 (L) 80.0 - 100.0 fL   MCH 24.3 (L) 26.0 - 34.0 pg   MCHC 31.8 30.0 - 36.0 g/dL   RDW 17.7 (H) 11.5 - 15.5 %   Platelets 371 150 - 400 K/uL   nRBC 0.0 0.0 - 0.2 %  Type and screen [facility]     Status: None (Preliminary result)   Collection Time: 02/15/20  7:20 AM  Result Value Ref Range   ABO/RH(D) PENDING    Antibody Screen PENDING    Sample Expiration      02/18/2020,2359 Performed at [facility], [place] [address], [place], [place] [address]    Pelvic US today: T/V images.  Anteverted uterus enlarged with multiple fibroids.  The overall uterine size is measured at 14.73 x 8.13 x 7.09 cm.  The largest fibroid is measured at 6.7 cm and is subserosal.  The other fibroids are intramural and subserosal measured at 6 cm, 4.9 cm, 5 cm, 2.3  cm and 2.3 cm.  The endometrial lining is measured at 7.43 mm and is distorted by the fibroids.  Both ovaries are normal in size with a follicular pattern.  No adnexal mass seen.  Mild clear fluid in the posterior cul-de-sac measured at 6.5 x 3.5 cm.  Hb 9.5 on 12/20/2019   Assessment/Plan:  45 y.o. [name]   1. Fibroids Menorrhagia with secondary anemia, hemoglobin 9.5 on December 20, 2019.  Pelvic ultrasound findings thoroughly reviewed with patient today.  Patient has an overall uterine size measured at 14.73 x 8.13 x 7.09 cm.  The fibroids (6) are subserosal and intramural from 6.7 to 2.3 cm.  The endometrial line is at 7.43 mm and distorted by  fibroids.  Ovaries are normal.  Given the patient's heavy menses with secondary anemia is not control by the contraceptive patch, decision to proceed with a surgical approach.  Patient prefers conservation of her uterus.  Will schedule a Sonata procedure with hysteroscopy and D&C.  Surgery reviewed with patient.  Will follow for a preop visit.  2. Menorrhagia with regular cycle As above.                        Patient was counseled as to the risk of surgery to include the following:  1. Infection (prohylactic antibiotics will be administered)  2. DVT/Pulmonary Embolism (prophylactic pneumo compression stockings will be used)  3.Trauma to internal organs requiring additional surgical procedure to repair any injury to internal organs requiring perhaps additional hospitalization days.  4.Hemmorhage requiring transfusion and blood products which carry risks such as     anaphylactic reaction, hepatitis and AIDS  Patient had received literature information on the procedure scheduled and all her questions were answered and fully accepts all risk.   Marie-Lyne Aviya Jarvie 02/15/2020, 7:56 AM

## 2020-02-15 NOTE — Anesthesia Postprocedure Evaluation (Signed)
Anesthesia Post Note  Patient: Alyssa [name]  Procedure(s) Performed: SONATA PROCEDURE, DILATATION AND CURETTAGE /HYSTEROSCOPY (N/A )     Patient location during evaluation: PACU Anesthesia Type: General Level of consciousness: awake Pain management: pain level controlled Vital Signs Assessment: post-procedure vital signs reviewed and stable Respiratory status: spontaneous breathing and respiratory function stable Cardiovascular status: stable Postop Assessment: no apparent nausea or vomiting Anesthetic complications: no   No complications documented.  Last Vitals:  Vitals:   02/15/20 1145 02/15/20 1200  BP: (!) 143/89 (!) 149/94  Pulse: 73 77  Resp: (!) 23 (!) 28  Temp:    SpO2: 99% 96%    Last Pain:  Vitals:   02/15/20 1145  TempSrc:   PainSc: 4                  Candra R Nayomi Tabron

## 2020-02-15 NOTE — Anesthesia Procedure Notes (Signed)
Procedure Name: LMA Insertion Date/Time: 02/15/2020 8:44 AM Performed by: Myna Bright, CRNA Pre-anesthesia Checklist: Patient identified, Emergency Drugs available, Suction available and Patient being monitored Patient Re-evaluated:Patient Re-evaluated prior to induction Oxygen Delivery Method: Circle system utilized Preoxygenation: Pre-oxygenation with 100% oxygen Induction Type: IV induction Ventilation: Mask ventilation without difficulty LMA: LMA inserted LMA Size: 4.0 Number of attempts: 1 Placement Confirmation: positive ETCO2 and breath sounds checked- equal and bilateral Tube secured with: Tape Dental Injury: Teeth and Oropharynx as per pre-operative assessment

## 2020-02-15 NOTE — Discharge Instructions (Addendum)
Post Anesthesia Home Care Instructions  Activity: Get plenty of rest for the remainder of the day. A responsible adult should stay with you for 24 hours following the procedure.  For the next 24 hours, DO NOT: -Drive a car -Paediatric nurse -Drink alcoholic beverages -Take any medication unless instructed by your physician -Make any legal decisions or sign important papers.  Meals: Start with liquid foods such as gelatin or soup. Progress to regular foods as tolerated. Avoid greasy, spicy, heavy foods. If nausea and/or vomiting occur, drink only clear liquids until the nausea and/or vomiting subsides. Call your physician if vomiting continues.  Special Instructions/Symptoms: Your throat may feel dry or sore from the anesthesia or the breathing tube placed in your throat during surgery. If this causes discomfort, gargle with warm salt water. The discomfort should disappear within 24 hours.  If you had a scopolamine patch placed behind your ear for the management of post- operative nausea and/or vomiting:  1. The medication in the patch is effective for 72 hours, after which it should be removed.  Wrap patch in a tissue and discard in the trash. Wash hands thoroughly with soap and water. 2. You may remove the patch earlier than 72 hours if you experience unpleasant side effects which may include dry mouth, dizziness or visual disturbances. 3. Avoid touching the patch. Wash your hands with soap and water after contact with the patch.   Hysteroscopy Hysteroscopy is a procedure used to look inside a woman's womb (uterus). This may be done for various reasons, including:  To look for tumors and other growths in the uterus.  To evaluate abnormal bleeding, fibroid tumors, polyps, scar tissue, or uterine cancer.  To determine why a woman is unable to get pregnant or has had repeated pregnancy losses.  To locate an IUD (intrauterine device).  To place a birth control device into the  fallopian tubes. During this procedure, a thin, flexible tube with a small light and camera (hysteroscope) is used to examine the uterus. The camera sends images to a monitor in the room so that your health care provider can view the inside of your uterus. A hysteroscopy should be done right after a menstrual period. Tell a health care provider about:  Any allergies you have.  All medicines you are taking, including vitamins, herbs, eye drops, creams, and over-the-counter medicines.  Any problems you or family members have had with anesthetic medicines.  Any blood disorders you have.  Any surgeries you have had.  Any medical conditions you have.  Whether you are pregnant or may be pregnant.  Whether you have been diagnosed with an STI (sexually transmitted infection) or you think you have an STI. What are the risks? Generally, this is a safe procedure. However, problems may occur, including:  Excessive bleeding.  Infection.  Damage to the uterus or other structures or organs.  Allergic reaction to medicines or fluids that are used in the procedure. What happens before the procedure? Staying hydrated Follow instructions from your health care provider about hydration, which may include:  Up to 2 hours before the procedure - you may continue to drink clear liquids, such as water, clear fruit juice, black coffee, and plain tea. Eating and drinking restrictions Follow instructions from your health care provider about eating and drinking, which may include:  8 hours before the procedure - stop eating solid foods and drink clear liquids only.  2 hours before the procedure - stop drinking clear liquids. Medicines  Ask your  health care provider about: ? Changing or stopping your regular medicines. This is especially important if you are taking diabetes medicines or blood thinners. ? Taking medicines such as aspirin and ibuprofen. These medicines can thin your blood. Do not take  these medicines unless your health care provider tells you to take them. ? Taking over-the-counter medicines, vitamins, herbs, and supplements.  Medicine may be placed in your cervix the day before the procedure. This medicine causes the cervix to open (dilate). The larger opening makes it easier for the hysteroscope to be inserted into the uterus during the procedure. General instructions  Ask your health care provider: ? What steps will be taken to help prevent infection. These steps may include:  Washing skin with a germ-killing soap.  Taking antibiotic medicine.  Do not use any products that contain nicotine or tobacco for at least 4 weeks before the procedure. These products include cigarettes, chewing tobacco, and vaping devices, such as e-cigarettes. If you need help quitting, ask your health care provider.  Plan to have a responsible adult take you home from the hospital or clinic.  Plan to have a responsible adult care for you for the time you are told after you leave the hospital or clinic. This is important.  Empty your bladder before the procedure begins. What happens during the procedure?  An IV will be inserted into one of your veins.  You may be given: ? A medicine to help you relax (sedative). ? A medicine that numbs the area around the cervix (local anesthetic). ? A medicine to make you fall asleep (general anesthetic).  A hysteroscope will be inserted through your vagina and into your uterus.  Air or fluid will be used to enlarge your uterus to allow your health care provider to see it better. The amount of fluid used will be carefully checked throughout the procedure.  In some cases, tissue may be gently scraped from inside the uterus and sent to a lab for testing (biopsy). The procedure may vary among health care providers and hospitals. What can I expect after the procedure?  Your blood pressure, heart rate, breathing rate, and blood oxygen level will be  monitored until you leave the hospital or clinic.  You may have cramps. You may be given medicines for this.  You may have bleeding, which may vary from light spotting to menstrual-like bleeding. This is normal.  If you had a biopsy, it is up to you to get the results. Ask your health care provider, or the department that is doing the procedure, when your results will be ready. Follow these instructions at home: Activity  Rest as told by your health care provider.  Return to your normal activities as told by your health care provider. Ask your health care provider what activities are safe for you.  If you were given a sedative during the procedure, it can affect you for several hours. Do not drive or operate machinery until your health care provider says that it is safe. Medicines  Do not take aspirin or other NSAIDs during recovery, as told by your healthcare provider. It can increase the risk of bleeding.  Ask your health care provider if the medicine prescribed to you: ? Requires you to avoid driving or using machinery. ? Can cause constipation. You may need to take these actions to prevent or treat constipation:  Drink enough fluid to keep your urine pale yellow.  Take over-the-counter or prescription medicines.  Eat foods that are high  in fiber, such as beans, whole grains, and fresh fruits and vegetables.  Limit foods that are high in fat and processed sugars, such as fried or sweet foods. General instructions  Do not douche, use tampons, or have sex for 2 weeks after the procedure, or until your health care provider approves.  Do not take baths, swim, or use a hot tub until your health care provider approves. Take showers instead of baths for 2 weeks, or for as long as told by your health care provider.  Keep all follow-up visits. This is important. Contact a health care provider if:  You feel dizzy or lightheaded.  You feel nauseous.  You have abnormal vaginal  discharge.  You have a rash.  You have pain that does not get better with medicine.  You have chills. Get help right away if:  You have bleeding that is heavier than a normal menstrual period.  You have a fever.  You have pain or cramps that get worse.  You develop new abdominal pain.  You faint.  You have pain in your shoulder.  You are short of breath. Summary  Hysteroscopy is a procedure that is used to look inside a woman's womb (uterus).  After the procedure, you may have bleeding, which varies from light spotting to menstrual-like bleeding. This is normal. You may also have cramps.  Do not douche, use tampons, or have sex for 2 weeks after the procedure, or until your health care provider approves.  Plan to have a responsible adult take you home from the hospital or clinic. This information is not intended to replace advice given to you by your health care provider. Make sure you discuss any questions you have with your health care provider. Document Revised: 08/22/2019 Document Reviewed: 08/22/2019 Elsevier Patient Education  2021 [employer].

## 2020-02-15 NOTE — Transfer of Care (Signed)
Immediate Anesthesia Transfer of Care Note  Patient: Alyssa [name]  Procedure(s) Performed: SONATA PROCEDURE, DILATATION AND CURETTAGE /HYSTEROSCOPY (N/A )  Patient Location: PACU  Anesthesia Type:General  Level of Consciousness: awake, alert , oriented and patient cooperative  Airway & Oxygen Therapy: Patient Spontanous Breathing and Patient connected to face mask oxygen  Post-op Assessment: Report given to RN, Post -op Vital signs reviewed and stable and Patient moving all extremities  Post vital signs: Reviewed and stable  Last Vitals:  Vitals Value Taken Time  BP 138/82 02/15/20 1107  Temp    Pulse 80 02/15/20 1111  Resp 29 02/15/20 1111  SpO2 97 % 02/15/20 1111  Vitals shown include unvalidated device data.  Last Pain:  Vitals:   02/15/20 0705  TempSrc: Oral  PainSc: 0-No pain      Patients Stated Pain Goal: 3 (47/42/[id number])  Complications: No complications documented.

## 2020-02-18 LAB — SURGICAL PATHOLOGY

## 2020-02-29 ENCOUNTER — Encounter: Payer: Self-pay | Admitting: Obstetrics & Gynecology

## 2020-02-29 ENCOUNTER — Ambulatory Visit (INDEPENDENT_AMBULATORY_CARE_PROVIDER_SITE_OTHER): Payer: BC Managed Care – PPO | Admitting: Obstetrics & Gynecology

## 2020-02-29 ENCOUNTER — Other Ambulatory Visit: Payer: Self-pay

## 2020-02-29 VITALS — BP 138/80

## 2020-02-29 DIAGNOSIS — Z3045 Encounter for surveillance of transdermal patch hormonal contraceptive device: Secondary | ICD-10-CM

## 2020-02-29 DIAGNOSIS — Z09 Encounter for follow-up examination after completed treatment for conditions other than malignant neoplasm: Secondary | ICD-10-CM

## 2020-02-29 DIAGNOSIS — I10 Essential (primary) hypertension: Secondary | ICD-10-CM

## 2020-02-29 NOTE — Progress Notes (Signed)
    Alyssa [name] 01-22-1975 [id number]        45 y.o.  [name]   RP: Postop [facility]/Sonata procedure 2 weeks ago  HPI:  Dx of cHTN at the time of surgery, seen by Fam MD and started on Norvasc, BP well controled on it.  Brown spotting with no pelvic pain or cramping.  Well on Xulane patch.  Urine/BMs normal.  No fever.   OB History  Gravida Para Term Preterm AB Living  1       1 0  SAB IAB Ectopic Multiple Live Births               # Outcome Date GA Lbr Len/2nd Weight Sex Delivery Anes PTL Lv  1 AB             Past medical history,surgical history, problem list, medications, allergies, family history and social history were all reviewed and documented in the EPIC chart.   Directed ROS with pertinent positives and negatives documented in the history of present illness/assessment and plan.  Exam:  Vitals:   02/29/20 0940  BP: 138/80   General appearance:  Normal  Abdomen: Normal  Gynecologic exam: Vulva normal.  Speculum:  Cervix/Vagina normal.  Very mild brownish secretions.  Bimanual exam:  Uterus about 10 cm nodular, mobile, NT.  No adnexal mass felt.   Assessment/Plan:  45 y.o. [name]   1. Status post gynecological surgery, follow-up exam Very good postop evolution/healing.  No active bleeding.  No Cx.  Uterine size smaller post Sonata.  Will reassess Uterine Fibroids by Pelvic US in 3 months. - US Transvaginal Non-OB; Future  2. Encounter for surveillance of transdermal patch hormonal contraceptive device Well on Xulane patch at patient's preference.  Doesn't like to take BCPs.  3. Chronic hypertension Dx of cHTN at the time of surgery.  Started on Norvasc by Fam MD.  BP 138/80 today.  Will continue on Norvasc.  Low salt diet.  Will change contraception if BP is difficult to control.   Other orders - amLODipine (NORVASC) 2.5 MG tablet; Take 2.5 mg by mouth daily.  [name] MD, 10:07 AM 02/29/2020

## 2020-03-26 ENCOUNTER — Telehealth: Payer: Self-pay | Admitting: *Deleted

## 2020-03-26 NOTE — Telephone Encounter (Signed)
Patient called to follow up from office visit on 02/29/20 she mentioned brown spotting at the visit. Patient said you told her no worries as the spotting light and should be gone after her 2nd cycle. Patient said her 2nd cycle was on 03/18/20 and still spotting. She is still using Xulane patch and placed at new patch on Sunday. Patient said she is tired of the spotting and said if this doesn't stop she wants to proceed with a hysterotomy. Patient would like to know what to do about the spotting? Please advise

## 2020-03-31 ENCOUNTER — Encounter: Payer: Self-pay | Admitting: Nurse Practitioner

## 2020-03-31 ENCOUNTER — Other Ambulatory Visit: Payer: Self-pay

## 2020-03-31 ENCOUNTER — Ambulatory Visit: Payer: BC Managed Care – PPO | Admitting: Nurse Practitioner

## 2020-03-31 VITALS — BP 120/78 | HR 76 | Resp 16 | Wt 171.0 lb

## 2020-03-31 DIAGNOSIS — B379 Candidiasis, unspecified: Secondary | ICD-10-CM | POA: Diagnosis not present

## 2020-03-31 DIAGNOSIS — N898 Other specified noninflammatory disorders of vagina: Secondary | ICD-10-CM | POA: Diagnosis not present

## 2020-03-31 DIAGNOSIS — Z113 Encounter for screening for infections with a predominantly sexual mode of transmission: Secondary | ICD-10-CM

## 2020-03-31 LAB — WET PREP FOR TRICH, YEAST, CLUE

## 2020-03-31 MED ORDER — FLUCONAZOLE 150 MG PO TABS: 150.0000 mg | ORAL_TABLET | Freq: Once | ORAL | 1 refills | Status: DC

## 2020-03-31 NOTE — Telephone Encounter (Signed)
Patient called this am c/o yellowish discharge now, I sent message to appointments to have patient schedule office visit to check,.

## 2020-03-31 NOTE — Patient Instructions (Signed)
Vaginal Yeast Infection, Adult  Vaginal yeast infection is a condition that causes vaginal discharge as well as soreness, swelling, and redness (inflammation) of the vagina. This is a common condition. Some women get this infection frequently. What are the causes? This condition is caused by a change in the normal balance of the yeast (candida) and bacteria that live in the vagina. This change causes an overgrowth of yeast, which causes the inflammation. What increases the risk? The condition is more likely to develop in women who:  Take antibiotic medicines.  Have diabetes.  Take birth control pills.  Are pregnant.  Douche often.  Have a weak body defense system (immune system).  Have been taking steroid medicines for a long time.  Frequently wear tight clothing. What are the signs or symptoms? Symptoms of this condition include:  White, thick, creamy vaginal discharge.  Swelling, itching, redness, and irritation of the vagina. The lips of the vagina (vulva) may be affected as well.  Pain or a burning feeling while urinating.  Pain during sex. How is this diagnosed? This condition is diagnosed based on:  Your medical history.  A physical exam.  A pelvic exam. Your health care provider will examine a sample of your vaginal discharge under a microscope. Your health care provider may send this sample for testing to confirm the diagnosis. How is this treated? This condition is treated with medicine. Medicines may be over-the-counter or prescription. You may be told to use one or more of the following:  Medicine that is taken by mouth (orally).  Medicine that is applied as a cream (topically).  Medicine that is inserted directly into the vagina (suppository). Follow these instructions at home: Lifestyle  Do not have sex until your health care provider approves. Tell your sex partner that you have a yeast infection. That person should go to his or her health care  provider and ask if they should also be treated.  Do not wear tight clothes, such as pantyhose or tight pants.  Wear breathable cotton underwear. General instructions  Take or apply over-the-counter and prescription medicines only as told by your health care provider.  Eat more yogurt. This may help to keep your yeast infection from returning.  Do not use tampons until your health care provider approves.  Try taking a sitz bath to help with discomfort. This is a warm water bath that is taken while you are sitting down. The water should only come up to your hips and should cover your buttocks. Do this 3-4 times per day or as told by your health care provider.  Do not douche.  If you have diabetes, keep your blood sugar levels under control.  Keep all follow-up visits as told by your health care provider. This is important.   Contact a health care provider if:  You have a fever.  Your symptoms go away and then return.  Your symptoms do not get better with treatment.  Your symptoms get worse.  You have new symptoms.  You develop blisters in or around your vagina.  You have blood coming from your vagina and it is not your menstrual period.  You develop pain in your abdomen. Summary  Vaginal yeast infection is a condition that causes discharge as well as soreness, swelling, and redness (inflammation) of the vagina.  This condition is treated with medicine. Medicines may be over-the-counter or prescription.  Take or apply over-the-counter and prescription medicines only as told by your health care provider.  Do not   douche. Do not have sex or use tampons until your health care provider approves.  Contact a health care provider if your symptoms do not get better with treatment or your symptoms go away and then return. This information is not intended to replace advice given to you by your health care provider. Make sure you discuss any questions you have with your health care  provider. Document Revised: 08/04/2018 Document Reviewed: 05/23/2017 Elsevier Patient Education  2021 Elsevier Inc.  

## 2020-03-31 NOTE — Progress Notes (Signed)
GYNECOLOGY  VISIT  CC:   Vaginal discharge  HPI: 45 y.o. [place] or African American female here for brownish yellow discharge.  Had Sonata procedure 02/15/2020. Last period was not as heavy as usual.Continues to use Xulane patch. Now in the second week of cycle, second Xulane patch, and has a lot vaginal discharge, yellow in color, thin. Denies vaginal itching, burning or odor.   Had GC/CT testing 3 months ago and does not think she could have been exposed but she wants testing today because she feels something isn't right.  She uses the treadmill 45 min in the morning, when she is done, she has abundant yellow discharge on her pad. This is very upsetting to her and she feels like she is ready for a hysterectomy.     GYNECOLOGIC HISTORY: Patient's last menstrual period was 03/25/2020 (exact date). Contraception: patch & condoms Menopausal hormone therapy: none  Patient Active Problem List   Diagnosis Date Noted  . Fibroid   . HSV infection   . STD (sexually transmitted disease) 08/18/2004  . 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 Anemia due to blood loss, chronic    due to menorrhagia  . Eczema   . History of 2019 novel coronavirus disease (COVID-19) 02/01/2020   pt tested coivd positive , result in epic, which was done for surgery.  per pt asymptomatic  . History of abnormal cervical Pap smear 10/2013   [facility]  . 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 History of herpes genitalis   . History of sexually transmitted disease 2006  . Menorrhagia   . Uterine fibroid     Past Surgical History:  Procedure Laterality Date  . CYSTOSCOPY WITH RETROGRADE PYELOGRAM, URETEROSCOPY AND STENT PLACEMENT  03-22-2005  @[facility]   . EXTRACORPOREAL SHOCK WAVE LITHOTRIPSY  03/2005  . GYNECOLOGIC CRYOSURGERY  2005  . HYSTEROSCOPY WITH RESECTOSCOPE  11-04-2008  @[facility]    MYOMECTOMY  . MANDIBLE SURGERY  Bilateral 01-30-2002  @[facility]    REMOVAL CYST'S BILATERAL MANDIBLE AND FOUR TEETH EXTRACTION'S  . UMBILICAL HERNIA REPAIR  10-01-1999  @[facility]     MEDS:   Current Outpatient Medications on File Prior to Visit  Medication Sig Dispense Refill  . APPLE CIDER VINEGAR PO Take by mouth. GOLI brand chews    . Ca Phosphate-Cholecalciferol (CALCIUM 500 + D3) 250-500 MG-UNIT CHEW Chew by mouth daily.    Marland Kitchen ELDERBERRY PO Take by mouth. chews    . norelgestromin-ethinyl estradiol [name]) 150-35 MCG/24HR transdermal patch APPLY 1 PATCH ON SKIN ONCE WEEKLY, CONTINUOUS USE FOR MENORRHAGIA 12 patch 4   No current facility-administered medications on file prior to visit.    ALLERGIES: Patient has no known allergies.  Family History  Problem Relation Age of Onset  . Diabetes Mother   . Hypertension Mother   . Kidney failure Mother   . Breast cancer Maternal Aunt        Age 63's  . Diabetes Maternal Grandmother   . Hypertension Maternal Grandmother      Review of Systems  Constitutional: Negative.   HENT: Negative.   Eyes: Negative.   Respiratory: Negative.   Cardiovascular: Negative.   Gastrointestinal: Negative.   Endocrine: Negative.   Genitourinary: Positive for vaginal discharge.  Musculoskeletal: Negative.   Skin: Negative.   Allergic/Immunologic: Negative.   Neurological: Negative.   Hematological: Negative.   Psychiatric/Behavioral: Negative.     PHYSICAL  EXAMINATION:    BP 120/78   Pulse 76   Resp 16   Wt 171 lb (77.6 kg)   LMP 03/25/2020 (Exact Date)   BMI 28.46 kg/m     General appearance: alert, cooperative, no acute distress  Lymph:  no inguinal LAD noted  Pelvic: External genitalia:  no lesions              Urethra:  normal appearing urethra with no masses, tenderness or lesions              Bartholins and Skenes: normal                 Vagina: normal appearing vagina, minimal discharge noted, white in color. Ph 4.5              Cervix: no cervical motion tenderness  and no lesions              Bimanual Exam:  Uterus:  firm and irregular, enlarged              Adnexa: no mass, fullness, tenderness              Wet mount + yeast, neg clue, neg whiff, mod WBC Chaperone, Joy, CMA, was present for exam.  Assessment: Although yeast detected, suspect vaginal discharge described (and shown to NP in pictures) is likely to be the result of fibroids and Sonata procedure. Reassured this is still normal since it has been about 6 weeks since procedure and should continue to improve.  Pt states she is not chronic hypertensive, she is not taking Norvac, only took for 2 days.  Plan: Continue Xulane as prescribed.  Screen for STD (sexually transmitted disease) - Plan: , C. trachomatis/N. gonorrhoeae RNA  Vaginal discharge- WET PREP FOR [place], YEAST, CLUE (+ yeast)  Yeast infection - Plan: fluconazole (DIFLUCAN) 150 MG tablet  F/U  05/29/2020 for Ultrasound and follow up with Dr. Dellis Filbert to discuss progress and next steps

## 2020-04-01 LAB — C. TRACHOMATIS/N. GONORRHOEAE RNA
C. trachomatis RNA, TMA: NOT DETECTED
N. gonorrhoeae RNA, TMA: NOT DETECTED

## 2020-04-07 ENCOUNTER — Telehealth: Payer: Self-pay

## 2020-04-07 NOTE — Telephone Encounter (Signed)
Patient informed. 

## 2020-04-07 NOTE — Telephone Encounter (Signed)
Please notify patient that this is nothing to worry about. Bacteria is normal in the vagina, in fact we WANT bacteria in the vagina in the form of lactobacillus.

## 2020-04-07 NOTE — Telephone Encounter (Signed)
Patient concerned because wet prep showed "moderate bacteria".

## 2020-05-15 ENCOUNTER — Telehealth: Payer: Self-pay | Admitting: *Deleted

## 2020-05-15 NOTE — Telephone Encounter (Signed)
Patient called stating her cycle start and she noticed something white coming out vagina when wiping. Patient said it is not toilet tissue, it looks like white tissue (flesh) she reports it only a small amount. She is not having any itching, vaginal odor, cramps which we discuss is likely due to the bleeding. I offered office visit with provider, she declined, she has ultrasound scheduled on 05/29/20. She agreed to monitor for now and will schedule office visit if she becomes more concerned.

## 2020-05-16 ENCOUNTER — Ambulatory Visit: Payer: BC Managed Care – PPO | Admitting: Obstetrics & Gynecology

## 2020-05-16 ENCOUNTER — Other Ambulatory Visit: Payer: Self-pay

## 2020-05-16 ENCOUNTER — Encounter: Payer: Self-pay | Admitting: Obstetrics & Gynecology

## 2020-05-16 VITALS — BP 138/86

## 2020-05-16 DIAGNOSIS — Z09 Encounter for follow-up examination after completed treatment for conditions other than malignant neoplasm: Secondary | ICD-10-CM

## 2020-05-16 DIAGNOSIS — D219 Benign neoplasm of connective and other soft tissue, unspecified: Secondary | ICD-10-CM | POA: Diagnosis not present

## 2020-05-16 DIAGNOSIS — R102 Pelvic and perineal pain: Secondary | ICD-10-CM | POA: Diagnosis not present

## 2020-05-16 NOTE — Progress Notes (Signed)
    MARLEA GAMBILL 07-May-1975 [id number]        45 y.o.  G1P0A1  RP: Passing tissue vaginally/pelvic cramping post Sonata procedure  HPI: Passed tissue vaginally c/w pieces of fibroids.  Overall bleeding is much less heavy, but having spotting and more pelvic cramping than before.  Requesting to proceed with a hysterectomy.   OB History  Gravida Para Term Preterm AB Living  1       1 0  SAB IAB Ectopic Multiple Live Births               # Outcome Date GA Lbr Len/2nd Weight Sex Delivery Anes PTL Lv  1 AB             Past medical history,surgical history, problem list, medications, allergies, family history and social history were all reviewed and documented in the EPIC chart.   Directed ROS with pertinent positives and negatives documented in the history of present illness/assessment and plan.  Exam:  Vitals:   05/16/20 1037  BP: 138/86   General appearance:  Normal  Abdomen: Normal  Gynecologic exam: Vulva normal.  Bimanual exam:  Uterus AV, nodular about 10 cm, mobile, mildly tender.  No adnexal mass, NT bilaterally.  No vaginal bleeding or d/c currently.   Assessment/Plan:  45 y.o. [name]   1. Pelvic pain in female Much improved menstrual flow post Sonata procedure, but still having breakthrough bleeding and pelvic cramps.  Patient reassured about passing small pieces of fibroids post Sonata.  Patient request to proceed with total hysterectomy, no desire to preserve fertility at age 52.  Decision to proceed with XI robotic total laparoscopic hysterectomy with bilateral salpingectomy.  We will preserve patient's ovaries. Patient understands and agrees with plan.  2. Fibroids Overall uterine size reduced post Sonata.  About 10 cm in diameter.  3. Status post gynecological surgery, follow-up exam No complication post Sonata.  Persistence of breakthrough bleeding and pelvic cramps and spite of decreased menstrual flow.  [name] MD, 11:05 AM  05/16/2020

## 2020-05-17 ENCOUNTER — Encounter: Payer: Self-pay | Admitting: Obstetrics & Gynecology

## 2020-05-19 ENCOUNTER — Telehealth: Payer: Self-pay

## 2020-05-19 NOTE — Telephone Encounter (Signed)
-----   Message from Burnice Logan, RN sent at 05/16/2020  1:00 PM EDT ----- Regarding: FW: Schedule surgery  ----- Message ----- From: [name], MD Sent: 05/16/2020  11:12 AM EDT To: Ramond Craver, RMA, Burnice Logan, RN Subject: Schedule surgery                               Surgery: XI Robotic Total Laparoscopic Hysterectomy with Bilateral Salpingectomy  Diagnosis: Fibroids/Pelvic cramping and pain  Location: [facility]  Status: Outpatient with Overnight Bed  Time: 150 Minutes  Assistant: First Available Provider, Olivia Mackie if possible  Urgency: First Available  Pre-Op Appointment: To Be Scheduled  Post-Op Appointment(s): 2 Weeks, 2 Weeks  Time Out Of Work: 6 Weeks

## 2020-05-19 NOTE — Telephone Encounter (Signed)
Spoke with patient. Reviewed surgery dates and Covid 19 testing and quarantine requirements.  Patient request to proceed with surgery on 07/23/20.  Patient has additional questions for business office, staff message sent for f/u.  Patient verbalizes understanding and is agreeable.   Routing to [employer]

## 2020-05-20 NOTE — Telephone Encounter (Signed)
Surgery request sent.  

## 2020-05-20 NOTE — Telephone Encounter (Signed)
Spoke with patient regarding surgery benefits. Patient acknowledges understanding of information presented. Patient is aware that benefits presented are professional benefits only. Patient is aware that once surgery is scheduled, the hospital will call with separate benefits. See account note.  Routing to Jill Hamm, RN, for surgery scheduling. 

## 2020-05-29 ENCOUNTER — Other Ambulatory Visit: Payer: BC Managed Care – PPO

## 2020-05-29 ENCOUNTER — Other Ambulatory Visit: Payer: BC Managed Care – PPO | Admitting: Obstetrics & Gynecology

## 2020-06-02 ENCOUNTER — Other Ambulatory Visit: Payer: Self-pay | Admitting: Obstetrics & Gynecology

## 2020-06-02 DIAGNOSIS — Z1231 Encounter for screening mammogram for malignant neoplasm of breast: Secondary | ICD-10-CM

## 2020-06-02 NOTE — Telephone Encounter (Signed)
Spoke with patient. Surgery date request confirmed.  Advised surgery is scheduled for 07/23/20, [place], 0830.  Surgery instruction sheet and hospital brochure reviewed, printed copy will be mailed. Patient advised of Covid screening and quarantine requirements and agreeable.   Pre-op scheduled for 06/30/20.   Routing to provider. Encounter closed.  Cc: KimAlexis

## 2020-06-12 ENCOUNTER — Encounter: Payer: Self-pay | Admitting: Obstetrics & Gynecology

## 2020-06-12 ENCOUNTER — Ambulatory Visit: Payer: BC Managed Care – PPO | Admitting: Obstetrics & Gynecology

## 2020-06-12 ENCOUNTER — Other Ambulatory Visit: Payer: Self-pay

## 2020-06-12 VITALS — BP 110/80

## 2020-06-12 DIAGNOSIS — L723 Sebaceous cyst: Secondary | ICD-10-CM

## 2020-06-12 NOTE — Progress Notes (Signed)
    Alyssa [name] 07/23/1975 [id number]        45 y.o.  [name]   RP: Rt vulvar lump  HPI: Noticed a small Rt vulvar lump while taking her shower.  The lump is not painful.  No drainage.  Declines STI screen.   OB History  Gravida Para Term Preterm AB Living  1       1 0  SAB IAB Ectopic Multiple Live Births               # Outcome Date GA Lbr Len/2nd Weight Sex Delivery Anes PTL Lv  1 AB             Past medical history,surgical history, problem list, medications, allergies, family history and social history were all reviewed and documented in the EPIC chart.   Directed ROS with pertinent positives and negatives documented in the history of present illness/assessment and plan.  Exam:  Vitals:   06/12/20 0941  BP: 110/80   General appearance:  Normal  Gynecologic exam: Vulva:  Rt post vulvar small Sebaceous cyst.  No sign of infection.   Assessment/Plan:  45 y.o. [name]   1. Sebaceous cyst Counseling and reassurance done.  Recommend warm soaking.    [name] MD, 10:07 AM 06/12/2020

## 2020-06-30 ENCOUNTER — Other Ambulatory Visit: Payer: Self-pay

## 2020-06-30 ENCOUNTER — Ambulatory Visit: Payer: BC Managed Care – PPO | Admitting: Obstetrics & Gynecology

## 2020-06-30 ENCOUNTER — Encounter: Payer: Self-pay | Admitting: Obstetrics & Gynecology

## 2020-06-30 VITALS — BP 118/70

## 2020-06-30 DIAGNOSIS — R102 Pelvic and perineal pain: Secondary | ICD-10-CM | POA: Diagnosis not present

## 2020-06-30 DIAGNOSIS — D219 Benign neoplasm of connective and other soft tissue, unspecified: Secondary | ICD-10-CM

## 2020-06-30 DIAGNOSIS — N920 Excessive and frequent menstruation with regular cycle: Secondary | ICD-10-CM | POA: Diagnosis not present

## 2020-06-30 NOTE — Progress Notes (Signed)
    Alyssa [name] [birth year] [id number]        45 y.o.  [name]   RP: Symptomatic Fibroids for Preop XI Robotic TLH/Bilateral Salpingectomy  HPI:  Continued pelvic pain with increased discharge and spotting.     OB History  Gravida Para Term Preterm AB Living  1       1 0  SAB IAB Ectopic Multiple Live Births               # Outcome Date GA Lbr Len/2nd Weight Sex Delivery Anes PTL Lv  1 AB             Past medical history,surgical history, problem list, medications, allergies, family history and social history were all reviewed and documented in the EPIC chart.   Directed ROS with pertinent positives and negatives documented in the history of present illness/assessment and plan.  Exam:  Vitals:   06/30/20 0930  BP: 118/70   General appearance:  Normal  Pelvic US 01/03/2020:  Anteverted uterus enlarged with multiple fibroids.  The overall uterine size is measured at 14.73 x 8.13 x 7.09 cm.  The largest fibroid is measured at 6.7 cm and is subserosal.  The other fibroids are intramural and subserosal measured at 6 cm, 4.9 cm, 5 cm, 2.3 cm and 2.3 cm.  The endometrial lining is measured at 7.43 mm and is distorted by the fibroids.  Both ovaries are normal in size with a follicular pattern.  No adnexal mass seen.  Mild clear fluid in the posterior cul-de-sac measured at 6.5 x 3.5 cm.  Had Sonata/Myosure on 02/15/2020   Assessment/Plan:  45 y.o. [name]   1. Fibroids Symptomatic uterine fibroids post Sonata/MyoSure done at the end of January 2022.  We will proceed with XI TLH/Bilateral Salpingectomy.  Preop preparation, Surgical procedure with risks and benefits and postop precautions and management discussed thoroughly.  Patient explained that Robotic Hysterectomy is done with 5 small incisions at the abdomen, which patient says she was not aware of.  Patient voiced understanding and agreement with the plan.  2. Pelvic pain in female Continued abdominopelvic pain and  discomfort.  3. Menorrhagia with regular cycle  As above.  Menses less heavy x Sonata procedure, but continued BTB and vaginal discharge.                        Patient was counseled as to the risk of surgery to include the following:  1. Infection (prohylactic antibiotics will be administered)  2. DVT/Pulmonary Embolism (prophylactic pneumo compression stockings will be used)  3.Trauma to internal organs requiring additional surgical procedure to repair any injury to internal organs requiring perhaps additional hospitalization days.  4.Hemmorhage requiring transfusion and blood products which carry risks such as anaphylactic reaction, hepatitis and AIDS  Patient had received literature information on the procedure scheduled and all her questions were answered and fully accepts all risk.    [name] MD, 10:09 AM 06/30/2020

## 2020-07-04 ENCOUNTER — Telehealth: Payer: Self-pay | Admitting: *Deleted

## 2020-07-04 NOTE — Telephone Encounter (Signed)
Forms scanned in system TEAM care and [place] pertaining to surgery

## 2020-07-07 NOTE — Telephone Encounter (Signed)
Spoke with patient over phone.

## 2020-07-16 ENCOUNTER — Other Ambulatory Visit: Payer: Self-pay

## 2020-07-16 ENCOUNTER — Encounter (HOSPITAL_BASED_OUTPATIENT_CLINIC_OR_DEPARTMENT_OTHER): Payer: Self-pay | Admitting: Obstetrics & Gynecology

## 2020-07-16 NOTE — Progress Notes (Addendum)
Spoke w/ via phone for pre-op interview---pt Lab needs dos---- urine preg            Lab results------lab appt 07-22-2020 915 am for cbc t & s COVID test -----patient states asymptomatic no test needed Arrive at -------630 am 07-23-2020 NPO after MN NO Solid Food.  Clear liquids from MN until---530 am  Med rec completed Medications to take morning of surgery -----none Diabetic medication -----n/a Patient instructed no nail polish to be worn day of surgery Patient instructed to bring photo id and insurance card day of surgery Patient aware to have Driver (ride ) / caregiver   friend Janett Billow mccormick [phone]  for 24 hours after surgery  Patient Special Instructions -----none Pre-Op special Istructions -----none Patient verbalized understanding of instructions that were given at this phone interview. Patient denies shortness of breath, chest pain, fever, cough at this phone interview.   PT REQUESTED FRIEND JESSICA MCCORMACK CELL [phone] TO BE CALLED BY DR LAVOIE OR NURSE AFTER SURGERY WITH UPDATE.  Pt has lower metal retainer and upper plastic retainer please remove prior to surgery

## 2020-07-16 NOTE — Progress Notes (Signed)
YOU ARE SCHEDULED FOR A COVID TEST ON    06-22-2020 @ 77 AM.  THIS TEST MUST BE DONE BEFORE SURGERY. GO TO  [facility]. [place], [place], IT IS APPROXIMATELY 2 MINUTES PAST [employer] ON THE RIGHT AND REMAIN IN YOUR CAR, THIS IS A DRIVE UP TEST.       Your procedure is scheduled on  07-23-2020  Report to [facility].   Call this number if you have problems the morning of surgery  :[phone].   OUR ADDRESS IS [place].  [facility] ARE LOCATED [facility].  PLEASE BRING YOUR INSURANCE CARD AND PHOTO ID DAY OF SURGERY.  ONLY ONE PERSON ALLOWED IN FACILITY WAITING AREA.                                     REMEMBER:  DO NOT EAT FOOD, CANDY GUM OR MINTS  AFTER MIDNIGHT . YOU MAY HAVE CLEAR LIQUIDS FROM MIDNIGHT UNTIL 530 AM.. NO CLEAR LIQUIDS AFTER 530 AM DAY OF SURGERY.   YOU MAY  BRUSH YOUR TEETH MORNING OF SURGERY AND RINSE YOUR MOUTH OUT, NO CHEWING GUM CANDY OR MINTS.    CLEAR LIQUID DIET   Foods Allowed                                                                     Foods Excluded  Coffee and tea, regular and decaf                             liquids that you cannot  Plain Jell-O any favor except red or purple                                           see through such as: Fruit ices (not with fruit pulp)                                     milk, soups, orange juice  Iced Popsicles                                    All solid food Carbonated beverages, regular and diet                                    Cranberry, grape and apple juices Sports drinks like Gatorade Lightly seasoned clear broth or consume(fat free) Sugar, honey syrup  Sample Menu Breakfast                                Lunch  Supper Cranberry juice                    Beef broth                            Chicken broth Jell-O                                     Grape juice                           Apple  juice Coffee or tea                        Jell-O                                      Popsicle                                                Coffee or tea                        Coffee or tea  _____________________________________________________________________     TAKE THESE MEDICATIONS MORNING OF SURGERY WITH A SIP OF WATER:  NONE.  ONE VISITOR IS ALLOWED IN WAITING ROOM ONLY DAY OF SURGERY.  NO VISITOR MAY SPEND THE NIGHT.  VISITOR ARE ALLOWED TO STAY UNTIL 800 PM.                                    DO NOT WEAR JEWERLY, MAKE UP. DO NOT WEAR LOTIONS, POWDERS, PERFUMES OR NAIL POLISH. DO NOT SHAVE FOR 24 HOURS PRIOR TO DAY OF SURGERY. MEN MAY SHAVE FACE AND NECK. CONTACTS, GLASSES, OR DENTURES MAY NOT BE WORN TO SURGERY.                                    [facility] IS NOT RESPONSIBLE  FOR ANY BELONGINGS.                                                                    Marland Kitchen           [facility] - Preparing for Surgery Before surgery, you can play an important role.  Because skin is not sterile, your skin needs to be as free of germs as possible.  You can reduce the number of germs on your skin by washing with CHG (chlorahexidine gluconate) soap before surgery.  CHG is an antiseptic cleaner which kills germs and bonds with the skin to continue killing germs even after washing. Please DO NOT use if you have an allergy to CHG or antibacterial soaps.  If your skin becomes reddened/irritated  stop using the CHG and inform your nurse when you arrive at Short Stay. Do not shave (including legs and underarms) for at least 48 hours prior to the first CHG shower.  You may shave your face/neck. Please follow these instructions carefully:  1.  Shower with CHG Soap the night before surgery and the  morning of Surgery.  2.  If you choose to wash your hair, wash your hair first as usual with your  normal  shampoo.  3.  After you shampoo, rinse your hair and body thoroughly to remove the  shampoo.                             4.  Use CHG as you would any other liquid soap.  You can apply chg directly  to the skin and wash                      Gently with a scrungie or clean washcloth.  5.  Apply the CHG Soap to your body ONLY FROM THE NECK DOWN.   Do not use on face/ open                           Wound or open sores. Avoid contact with eyes, ears mouth and genitals (private parts).                       Wash face,  Genitals (private parts) with your normal soap.             6.  Wash thoroughly, paying special attention to the area where your surgery  will be performed.  7.  Thoroughly rinse your body with warm water from the neck down.  8.  DO NOT shower/wash with your normal soap after using and rinsing off  the CHG Soap.                9.  Pat yourself dry with a clean towel.            10.  Wear clean pajamas.            11.  Place clean sheets on your bed the night of your first shower and do not  sleep with pets. Day of Surgery : Do not apply any lotions/deodorants the morning of surgery.  Please wear clean clothes to the hospital/surgery center.  FAILURE TO FOLLOW THESE INSTRUCTIONS MAY RESULT IN THE CANCELLATION OF YOUR SURGERY PATIENT SIGNATURE_________________________________  NURSE SIGNATURE__________________________________  ________________________________________________________________________                                                        QUESTIONS Hansel Feinstein PRE OP NURSE PHONE [phone].

## 2020-07-18 DIAGNOSIS — Z0289 Encounter for other administrative examinations: Secondary | ICD-10-CM

## 2020-07-22 ENCOUNTER — Other Ambulatory Visit (HOSPITAL_COMMUNITY)
Admission: RE | Admit: 2020-07-22 | Discharge: 2020-07-22 | Disposition: A | Discharge status: Home / Self Care | Payer: BC Managed Care – PPO | Source: Ambulatory Visit | Attending: Obstetrics & Gynecology | Admitting: Obstetrics & Gynecology

## 2020-07-22 ENCOUNTER — Telehealth: Payer: Self-pay

## 2020-07-22 ENCOUNTER — Other Ambulatory Visit: Payer: Self-pay

## 2020-07-22 ENCOUNTER — Encounter (HOSPITAL_COMMUNITY)
Admission: RE | Admit: 2020-07-22 | Discharge: 2020-07-22 | Disposition: A | Discharge status: Home / Self Care | Payer: BC Managed Care – PPO | Source: Ambulatory Visit | Attending: Obstetrics & Gynecology | Admitting: Obstetrics & Gynecology

## 2020-07-22 DIAGNOSIS — Z01818 Encounter for other preprocedural examination: Secondary | ICD-10-CM | POA: Insufficient documentation

## 2020-07-22 DIAGNOSIS — Z20822 Contact with and (suspected) exposure to covid-19: Secondary | ICD-10-CM | POA: Insufficient documentation

## 2020-07-22 LAB — CBC
HCT: 36.1 % (ref 36.0–46.0)
Hemoglobin: 11.2 g/dL — ABNORMAL LOW (ref 12.0–15.0)
MCH: 24.2 pg — ABNORMAL LOW (ref 26.0–34.0)
MCHC: 31 g/dL (ref 30.0–36.0)
MCV: 78 fL — ABNORMAL LOW (ref 80.0–100.0)
Platelets: 388 10*3/uL (ref 150–400)
RBC: 4.63 MIL/uL (ref 3.87–5.11)
RDW: 18.9 % — ABNORMAL HIGH (ref 11.5–15.5)
WBC: 4.8 10*3/uL (ref 4.0–10.5)
nRBC: 0 % (ref 0.0–0.2)

## 2020-07-22 NOTE — Telephone Encounter (Signed)
At Dr. Assunta Curtis request at 4:56pm I called patient and was on the phone with her until 5:16pm.  I informed her, per Dr. Dellis Filbert, that her BP at her pre-op visit this morning, was elevated and anesthesia is not willing to do her surgery tomorrow.    Patient was extremely upset about the late hour we called her as she states she does not have hypertension and had we let her know earlier this morning she would have gone to her PCP today and been cleared. I apologized over and over for the late hour.   She said that she has been to our office every month for the last few months and her BP is always normal. She said that it elevates with anxiety and she also blamed her diet from yesterday where she ate country salted ham.  She asked me to put her on hold and let her speak with Dr. Dellis Filbert and I did but Dr. Dellis Filbert had left the office for the day. I explained to her that anesthesia made the call to cancel surgery.  I will call her PCP for her in the morning and schedule visit for her to get medical clearance for her BP.

## 2020-07-22 NOTE — Progress Notes (Signed)
SPOKE WITH AMNADA AT ON CALL SERVICE AND LEFT MESSAGE FOR ON CALL DR TO NOTR [phone] DUE TO PT BP HIGH AT PRE OP VISIT AND PT TO BE CANCELLED FOR SURGERY 07-23-2020 AND SEE PRIMARY MD FOR BP CONTROL PER DR GEORGE ROSE MDA, BP AT PRE OP WAS 199/122 AND 196/122 AND 185/125 TODAY.

## 2020-07-22 NOTE — Progress Notes (Signed)
SPOKE WITH DR Dellis Filbert AND MADE AWARE PT BP ELEVATED AT PRE OP AND PT NEEDS TO SEE PCP AND GET BP MEDS BEFORE HAVING SURGERY PER DR GEORGE ROSE ANESTHESIA, DR Dellis Filbert TO RESCHEULE PT SURGERY FOR 07-23-2020 AND OFFICE TO CALL AND MAKE PT AWARE

## 2020-07-23 ENCOUNTER — Encounter (HOSPITAL_BASED_OUTPATIENT_CLINIC_OR_DEPARTMENT_OTHER): Admission: RE | Payer: Self-pay | Source: Home / Self Care

## 2020-07-23 ENCOUNTER — Telehealth (HOSPITAL_COMMUNITY): Payer: Self-pay

## 2020-07-23 ENCOUNTER — Ambulatory Visit (HOSPITAL_BASED_OUTPATIENT_CLINIC_OR_DEPARTMENT_OTHER)
Admission: RE | Admit: 2020-07-23 | Payer: BC Managed Care – PPO | Source: Home / Self Care | Admitting: Obstetrics & Gynecology

## 2020-07-23 HISTORY — DX: Personal history of urinary calculi: Z87.442

## 2020-07-23 LAB — SARS CORONAVIRUS 2 (TAT 6-24 HRS): SARS Coronavirus 2: NEGATIVE

## 2020-07-23 LAB — TYPE AND SCREEN
ABO/RH(D): AB POS
Antibody Screen: NEGATIVE

## 2020-07-23 SURGERY — XI ROBOTIC ASSISTED LAPAROSCOPIC HYSTERECTOMY AND SALPINGECTOMY
Anesthesia: Choice | Laterality: Bilateral

## 2020-07-23 NOTE — Telephone Encounter (Signed)
Appt with PCP scheduled for 07/24/20.  Records faxed. (EKG, pre op labs and note regarding BP's being elevated and surgery cancelled.). A medical clearance form was also sent to be signed by her PCP.

## 2020-07-24 NOTE — Telephone Encounter (Signed)
Surgery was canceled waive $25 fee for FMLA forms when she schedules surgery again per Glorianne Manchester RN.

## 2020-07-28 ENCOUNTER — Telehealth: Payer: Self-pay | Admitting: *Deleted

## 2020-07-28 NOTE — Telephone Encounter (Signed)
Patient called to report her blood pressure from PCP OV on 07/25/20.  Patient blood pressure was 158/100 at that visit,she was started on BP medication. Reports her PCP will sign medical clearance from when her blood pressure is at least 140/90.

## 2020-07-29 ENCOUNTER — Other Ambulatory Visit: Payer: Self-pay

## 2020-07-29 ENCOUNTER — Ambulatory Visit
Admission: RE | Admit: 2020-07-29 | Discharge: 2020-07-29 | Disposition: A | Discharge status: Home / Self Care | Payer: BC Managed Care – PPO | Source: Ambulatory Visit | Attending: Obstetrics & Gynecology | Admitting: Obstetrics & Gynecology

## 2020-07-29 DIAGNOSIS — Z1231 Encounter for screening mammogram for malignant neoplasm of breast: Secondary | ICD-10-CM

## 2020-08-07 ENCOUNTER — Ambulatory Visit: Payer: Self-pay | Admitting: Obstetrics & Gynecology

## 2020-08-13 ENCOUNTER — Telehealth: Payer: Self-pay | Admitting: *Deleted

## 2020-08-13 NOTE — Telephone Encounter (Signed)
Spoke with patient. Patient states she was seen by her PCP on 08/08/20 and cleared for surgery. States clearance has been faxed. Patient is requesting to proceed with surgery. Reviewed surgery dates, would like to proceed with scheduling on 8/23. Advised patient I will confirm surgical clearance and return call to provide update, patient agreeable.   Dr. Dellis Filbert -have you received surgical clearance from [facility]/ Dr. Ashby Dawes?

## 2020-08-13 NOTE — Telephone Encounter (Signed)
Surgical clearance received, to Dr. Dellis Filbert to review.   Surgery request confirmed for 09/09/20.

## 2020-08-14 NOTE — Telephone Encounter (Signed)
Routing to [employer]

## 2020-08-22 NOTE — Telephone Encounter (Signed)
Routing to [employer].

## 2020-08-25 NOTE — Telephone Encounter (Signed)
Spoke with patient. Surgery date request confirmed.  Advised surgery is scheduled for 09/04/20, [facility] at 2:45 pm.  Surgery instruction sheet and hospital brochure reviewed, printed copy will be mailed.  Patient advised if Covid screening and quarantine requirements and agreeable.   Routing to provider. Encounter closed.   Cc: Hayley Carder

## 2020-09-03 ENCOUNTER — Other Ambulatory Visit: Payer: Self-pay

## 2020-09-03 ENCOUNTER — Encounter (HOSPITAL_BASED_OUTPATIENT_CLINIC_OR_DEPARTMENT_OTHER): Payer: Self-pay | Admitting: Obstetrics & Gynecology

## 2020-09-03 NOTE — Progress Notes (Signed)
Spoke w/ via phone for pre-op interview---pt Lab needs dos----  urine preg             Lab results------ekg 07-22-2020 chart/epic, medical clearance note k nate no 07-19-2020 on chart for 09-09-2020 surgery COVID test -----09-05-2020 (overnight stay) Arrive at -------900 am 09-09-2020 NPO after MN NO Solid Food.  Clear liquids from MN until---800 am Med rec completed Medications to take morning of surgery -----amlodipine, metoprolol succinate Diabetic medication -----n/a Patient instructed no nail polish to be worn day of surgery Patient instructed to bring photo id and insurance card day of surgery Patient aware to have Driver (ride ) / caregiver   Janett Billow friend  for 24 hours after surgery  Patient Special Instructions -----none Pre-Op special Istructions -----none Patient verbalized understanding of instructions that were given at this phone interview. Patient denies shortness of breath, chest pain, fever, cough at this phone interview.   Pt requests  dr Dellis Filbert to call friend Jodi Marble cell [phone] with update after surgery please.  Pt has lower metal retainer and upper plastic retainer please remove prior to surgery.

## 2020-09-03 NOTE — Progress Notes (Signed)
YOU ARE SCHEDULED FOR A COVID TEST ON  09-05-2020  . THIS TEST MUST BE DONE BEFORE SURGERY. GO TO  [facility] [place] IN YOUR CAR, THIS IS A DRIVE UP TEST. AFTER YOUR COVID TEST , PLEASE WEAR A MASK OUT IN PUBLIC AND SOCIAL DISTANCE AND [place] YOUR HANDS FREQUENTLY. PLEASE ASK ALL YOUR CLOSE HOUSEHOLD CONTACT TO WEAR MASK OUT IN PUBLIC AND SOCIAL DISTANCE AND [place] HANDS FREQUENTLY ALSO.      Your procedure is scheduled on 09-09-2020  Report to [facility]. M.   Call this number if you have problems the morning of surgery  [phone].   OUR ADDRESS IS [place].  [facility].  PLEASE BRING YOUR INSURANCE CARD AND PHOTO ID DAY OF SURGERY.  ONLY ONE PERSON ALLOWED IN FACILITY WAITING AREA.                                     REMEMBER:  DO NOT EAT FOOD, CANDY GUM OR MINTS  AFTER MIDNIGHT . YOU MAY HAVE CLEAR LIQUIDS FROM MIDNIGHT UNTIL 800 AM. NO CLEAR LIQUIDS AFTER 800 AM DAY OF SURGERY.   YOU MAY  BRUSH YOUR TEETH MORNING OF SURGERY AND RINSE YOUR MOUTH OUT, NO CHEWING GUM CANDY OR MINTS.    CLEAR LIQUID DIET   Foods Allowed                                                                     Foods Excluded  Coffee and tea, regular and decaf                             liquids that you cannot  Plain Jell-O any favor except red or purple                                           see through such as: Fruit ices (not with fruit pulp)                                     milk, soups, orange juice  Iced Popsicles                                    All solid food Carbonated beverages, regular and diet                                    Cranberry, grape and apple juices Sports drinks like Gatorade Lightly seasoned clear broth or consume(fat free) Sugar, honey syrup  Sample Menu Breakfast  Lunch                                      Supper Cranberry juice                    Beef broth                            Chicken broth Jell-O                                     Grape juice                           Apple juice Coffee or tea                        Jell-O                                      Popsicle                                                Coffee or tea                        Coffee or tea  _____________________________________________________________________     TAKE THESE MEDICATIONS MORNING OF SURGERY WITH A SIP OF WATER: AMLODIPINE, METOPROLOL SUCCINATE.  ONE VISITOR IS ALLOWED IN WAITING ROOM ONLY DAY OF SURGERY.  NO VISITOR MAY SPEND THE NIGHT.  VISITOR ARE ALLOWED TO STAY UNTIL 800 PM.                                    DO NOT WEAR JEWERLY, MAKE UP. DO NOT WEAR LOTIONS, POWDERS, PERFUMES OR NAIL POLISH. DO NOT SHAVE FOR 48 HOURS PRIOR TO DAY OF SURGERY. MEN MAY SHAVE FACE AND NECK. CONTACTS, GLASSES, OR DENTURES MAY NOT BE WORN TO SURGERY.                                    [facility] IS NOT RESPONSIBLE  FOR ANY BELONGINGS.                                                                    Marland Kitchen           [facility] - Preparing for Surgery Before surgery, you can play an important role.  Because skin is not sterile, your skin needs to be as free of germs as possible.  You can reduce the number of germs on your skin by washing with CHG (chlorahexidine gluconate) soap before surgery.  CHG is  an antiseptic cleaner which kills germs and bonds with the skin to continue killing germs even after washing. Please DO NOT use if you have an allergy to CHG or antibacterial soaps.  If your skin becomes reddened/irritated stop using the CHG and inform your nurse when you arrive at Short Stay. Do not shave (including legs and underarms) for at least 48 hours prior to the first CHG shower.  You may shave your face/neck. Please follow these instructions carefully:  1.  Shower with CHG Soap the night before surgery and  the  morning of Surgery.  2.  If you choose to wash your hair, wash your hair first as usual with your  normal  shampoo.  3.  After you shampoo, rinse your hair and body thoroughly to remove the  shampoo.                            4.  Use CHG as you would any other liquid soap.  You can apply chg directly  to the skin and wash                      Gently with a scrungie or clean washcloth.  5.  Apply the CHG Soap to your body ONLY FROM THE NECK DOWN.   Do not use on face/ open                           Wound or open sores. Avoid contact with eyes, ears mouth and genitals (private parts).                       Wash face,  Genitals (private parts) with your normal soap.             6.  Wash thoroughly, paying special attention to the area where your surgery  will be performed.  7.  Thoroughly rinse your body with warm water from the neck down.  8.  DO NOT shower/wash with your normal soap after using and rinsing off  the CHG Soap.                9.  Pat yourself dry with a clean towel.            10.  Wear clean pajamas.            11.  Place clean sheets on your bed the night of your first shower and do not  sleep with pets. Day of Surgery : Do not apply any lotions/deodorants the morning of surgery.  Please wear clean clothes to the hospital/surgery center.  FAILURE TO FOLLOW THESE INSTRUCTIONS MAY RESULT IN THE CANCELLATION OF YOUR SURGERY PATIENT SIGNATURE_________________________________  NURSE SIGNATURE__________________________________  ________________________________________________________________________                                                        QUESTIONS Hansel Feinstein PRE OP NURSE PHONE [phone].

## 2020-09-04 ENCOUNTER — Encounter: Payer: Self-pay | Admitting: Obstetrics & Gynecology

## 2020-09-04 ENCOUNTER — Ambulatory Visit (INDEPENDENT_AMBULATORY_CARE_PROVIDER_SITE_OTHER): Payer: BC Managed Care – PPO | Admitting: Obstetrics & Gynecology

## 2020-09-04 VITALS — BP 124/80 | HR 84 | Resp 16 | Ht 62.75 in | Wt 168.0 lb

## 2020-09-04 DIAGNOSIS — D219 Benign neoplasm of connective and other soft tissue, unspecified: Secondary | ICD-10-CM | POA: Diagnosis not present

## 2020-09-04 DIAGNOSIS — R102 Pelvic and perineal pain: Secondary | ICD-10-CM

## 2020-09-04 DIAGNOSIS — B379 Candidiasis, unspecified: Secondary | ICD-10-CM | POA: Diagnosis not present

## 2020-09-04 MED ORDER — FLUCONAZOLE 150 MG PO TABS: 150.0000 mg | ORAL_TABLET | Freq: Every day | ORAL | 1 refills | Status: DC

## 2020-09-04 NOTE — Progress Notes (Signed)
    Alyssa [name] 07/20/1975 [id number]        44 y.o.  [name]   RP: Preop XI RoboticTLH/Bilateral Salpingectomy 09/09/2020  HPI: Continued pelvic pain with BTB post Sonata.  No heavy menses anymore.     OB History  Gravida Para Term Preterm AB Living  1       1 0  SAB IAB Ectopic Multiple Live Births               # Outcome Date GA Lbr Len/2nd Weight Sex Delivery Anes PTL Lv  1 AB             Past medical history,surgical history, problem list, medications, allergies, family history and social history were all reviewed and documented in the EPIC chart.   Directed ROS with pertinent positives and negatives documented in the history of present illness/assessment and plan.  Exam:  Vitals:   09/04/20 1457  BP: 124/80  Pulse: 84  Resp: 16  Weight: 168 lb (76.2 kg)  Height: 5' 2.75" (1.594 m)   General appearance:  Normal  Gynecologic exam: Deferred  Pelvic US 01/03/2020: T/V images.  Anteverted uterus enlarged with multiple fibroids.  The overall uterine size is measured at 14.73 x 8.13 x 7.09 cm.  The largest fibroid is measured at 6.7 cm and is subserosal.  The other fibroids are intramural and subserosal measured at 6 cm, 4.9 cm, 5 cm, 2.3 cm and 2.3 cm.  The endometrial lining is measured at 7.43 mm and is distorted by the fibroids.  Both ovaries are normal in size with a follicular pattern.  No adnexal mass seen.  Mild clear fluid in the posterior cul-de-sac measured at 6.5 x 3.5 cm.   Sonata procedure 02/15/2020.   Assessment/Plan:  45 y.o. [name]   1. Fibroids Uterine fibroids. No longer experiencing heavy manses and overall size of the uterus with fibroid is decreased since Sonata procedure.  Given that patient is still experiencing breakthrough bleeding and pelvic discomfort, she requests hysterectomy.  Decision to proceed with XI TLH/Bilateral Salpingectomy on 09/09/2020.  Preop preparation, surgery and risks, as well as postop precautions and expectations  thoroughly reviewed with patient.  Patient voiced understanding and agreement with plan.  2. Pelvic pain in female As above.  3. Yeast infection Tendency for yeast vaginitis post ABTx.  Fluconazole prescription sent to pharmacy for postop.  Other orders - amLODipine (NORVASC) 10 MG tablet; Take 10 mg by mouth daily. - fluconazole (DIFLUCAN) 150 MG tablet; Take 1 tablet (150 mg total) by mouth daily for 3 days.                         Patient was counseled as to the risk of surgery to include the following:  1. Infection (prohylactic antibiotics will be administered)  2. DVT/Pulmonary Embolism (prophylactic pneumo compression stockings will be used)  3.Trauma to internal organs requiring additional surgical procedure to repair any injury to internal organs requiring perhaps additional hospitalization days.  4.Hemmorhage requiring transfusion and blood products which carry risks such as anaphylactic reaction, hepatitis and AIDS  Patient had received literature information on the procedure scheduled and all her questions were answered and fully accepts all risk.    [name] MD, 3:21 PM 09/04/2020

## 2020-09-05 ENCOUNTER — Other Ambulatory Visit: Payer: Self-pay

## 2020-09-05 ENCOUNTER — Other Ambulatory Visit: Payer: Self-pay | Admitting: Obstetrics & Gynecology

## 2020-09-05 ENCOUNTER — Encounter (HOSPITAL_COMMUNITY)
Admission: RE | Admit: 2020-09-05 | Discharge: 2020-09-05 | Disposition: A | Discharge status: Home / Self Care | Payer: BC Managed Care – PPO | Source: Ambulatory Visit | Attending: Obstetrics & Gynecology | Admitting: Obstetrics & Gynecology

## 2020-09-05 DIAGNOSIS — Z01812 Encounter for preprocedural laboratory examination: Secondary | ICD-10-CM | POA: Diagnosis not present

## 2020-09-05 LAB — BASIC METABOLIC PANEL
Anion gap: 7 (ref 5–15)
BUN: 9 mg/dL (ref 6–20)
CO2: 24 mmol/L (ref 22–32)
Calcium: 9.2 mg/dL (ref 8.9–10.3)
Chloride: 106 mmol/L (ref 98–111)
Creatinine, Ser: 0.73 mg/dL (ref 0.44–1.00)
GFR, Estimated: >60 mL/min (ref >60)
Glucose, Bld: 94 mg/dL (ref 70–99)
Potassium: 4 mmol/L (ref 3.5–5.1)
Sodium: 137 mmol/L (ref 135–145)

## 2020-09-05 LAB — CBC
HCT: 37 % (ref 36.0–46.0)
Hemoglobin: 12 g/dL (ref 12.0–15.0)
MCH: 26.2 pg (ref 26.0–34.0)
MCHC: 32.4 g/dL (ref 30.0–36.0)
MCV: 80.8 fL (ref 80.0–100.0)
Platelets: 398 10*3/uL (ref 150–400)
RBC: 4.58 MIL/uL (ref 3.87–5.11)
RDW: 17.6 % — ABNORMAL HIGH (ref 11.5–15.5)
WBC: 4.9 10*3/uL (ref 4.0–10.5)
nRBC: 0 % (ref 0.0–0.2)

## 2020-09-05 LAB — SARS CORONAVIRUS 2 (TAT 6-24 HRS): SARS Coronavirus 2: NEGATIVE

## 2020-09-05 NOTE — Progress Notes (Signed)
Pt arrived for her PAT lab and bp check today.  Pt stated she had taken her bp meds this morning.  First bp 164/ 100.  Had pt wait for 30 minutes and right arm 142/ 100, left arm 140/ 100, then waited 10 minutes by this time pt upset/ crying that her surgery may be cancelled again, bp 143/ 93.  Spoke w/ APP, Willeen Cass NP,  stated pt bp was ok at Dr Dellis Filbert yesterday (124/ 80) and taking in account for pt being upset and nervous today and bp did go down, pt will be ok to proceed.

## 2020-09-07 ENCOUNTER — Encounter: Payer: Self-pay | Admitting: Obstetrics & Gynecology

## 2020-09-09 ENCOUNTER — Encounter (HOSPITAL_BASED_OUTPATIENT_CLINIC_OR_DEPARTMENT_OTHER): Payer: Self-pay | Admitting: Obstetrics & Gynecology

## 2020-09-09 ENCOUNTER — Ambulatory Visit (HOSPITAL_BASED_OUTPATIENT_CLINIC_OR_DEPARTMENT_OTHER): Payer: BC Managed Care – PPO | Admitting: Certified Registered Nurse Anesthetist

## 2020-09-09 ENCOUNTER — Observation Stay (HOSPITAL_BASED_OUTPATIENT_CLINIC_OR_DEPARTMENT_OTHER)
Admission: RE | Admit: 2020-09-09 | Discharge: 2020-09-10 | Disposition: A | Discharge status: Home / Self Care | Payer: BC Managed Care – PPO | Attending: Obstetrics & Gynecology | Admitting: Obstetrics & Gynecology

## 2020-09-09 ENCOUNTER — Encounter (HOSPITAL_BASED_OUTPATIENT_CLINIC_OR_DEPARTMENT_OTHER)
Admission: RE | Disposition: A | Discharge status: Home / Self Care | Payer: Self-pay | Source: Home / Self Care | Attending: Obstetrics & Gynecology

## 2020-09-09 DIAGNOSIS — D219 Benign neoplasm of connective and other soft tissue, unspecified: Secondary | ICD-10-CM

## 2020-09-09 DIAGNOSIS — B379 Candidiasis, unspecified: Secondary | ICD-10-CM | POA: Insufficient documentation

## 2020-09-09 DIAGNOSIS — D259 Leiomyoma of uterus, unspecified: Principal | ICD-10-CM | POA: Insufficient documentation

## 2020-09-09 DIAGNOSIS — Z9889 Other specified postprocedural states: Secondary | ICD-10-CM | POA: Diagnosis not present

## 2020-09-09 DIAGNOSIS — Z8616 Personal history of COVID-19: Secondary | ICD-10-CM | POA: Diagnosis not present

## 2020-09-09 DIAGNOSIS — R102 Pelvic and perineal pain: Secondary | ICD-10-CM

## 2020-09-09 DIAGNOSIS — I10 Essential (primary) hypertension: Secondary | ICD-10-CM | POA: Diagnosis not present

## 2020-09-09 DIAGNOSIS — N8501 Benign endometrial hyperplasia: Secondary | ICD-10-CM | POA: Insufficient documentation

## 2020-09-09 DIAGNOSIS — Z79899 Other long term (current) drug therapy: Secondary | ICD-10-CM | POA: Diagnosis not present

## 2020-09-09 HISTORY — PX: ROBOTIC ASSISTED TOTAL HYSTERECTOMY: SHX6085

## 2020-09-09 LAB — POCT PREGNANCY, URINE: Preg Test, Ur: NEGATIVE

## 2020-09-09 LAB — TYPE AND SCREEN
ABO/RH(D): AB POS
Antibody Screen: NEGATIVE

## 2020-09-09 LAB — CBC
HCT: 36.7 % (ref 36.0–46.0)
Hemoglobin: 11.8 g/dL — ABNORMAL LOW (ref 12.0–15.0)
MCH: 26.2 pg (ref 26.0–34.0)
MCHC: 32.2 g/dL (ref 30.0–36.0)
MCV: 81.6 fL (ref 80.0–100.0)
Platelets: 341 10*3/uL (ref 150–400)
RBC: 4.5 MIL/uL (ref 3.87–5.11)
RDW: 17.3 % — ABNORMAL HIGH (ref 11.5–15.5)
WBC: 9.7 10*3/uL (ref 4.0–10.5)
nRBC: 0 % (ref 0.0–0.2)

## 2020-09-09 SURGERY — HYSTERECTOMY, TOTAL, ROBOT-ASSISTED
Anesthesia: General | Site: Abdomen

## 2020-09-09 MED ORDER — PROPOFOL 10 MG/ML IV BOLUS
INTRAVENOUS | Status: DC | PRN
  Administered 2020-09-09: 200 mg via INTRAVENOUS

## 2020-09-09 MED ORDER — FENTANYL CITRATE (PF) 100 MCG/2ML IJ SOLN
INTRAMUSCULAR | Status: DC | PRN
  Administered 2020-09-09: 50 ug via INTRAVENOUS
  Administered 2020-09-09: 100 ug via INTRAVENOUS
  Administered 2020-09-09: 50 ug via INTRAVENOUS

## 2020-09-09 MED ORDER — OXYCODONE HCL 5 MG/5ML PO SOLN
5.0000 mg | Freq: Once | ORAL | Status: DC | PRN
Start: 2020-09-09 — End: 2020-09-09

## 2020-09-09 MED ORDER — OXYCODONE-ACETAMINOPHEN 5-325 MG PO TABS
2.0000 | ORAL_TABLET | ORAL | Status: DC | PRN
  Administered 2020-09-09: 2 via ORAL

## 2020-09-09 MED ORDER — KETOROLAC TROMETHAMINE 30 MG/ML IJ SOLN: 30.0000 mg | Freq: Once | INTRAMUSCULAR | Status: DC | PRN

## 2020-09-09 MED ORDER — HYDROCODONE-ACETAMINOPHEN 5-325 MG PO TABS
1.0000 | ORAL_TABLET | ORAL | Status: DC | PRN
  Administered 2020-09-09: 2 via ORAL

## 2020-09-09 MED ORDER — SODIUM CHLORIDE 0.9 % IR SOLN
Status: DC | PRN
  Administered 2020-09-09: 1000 mL

## 2020-09-09 MED ORDER — MIDAZOLAM HCL 2 MG/2ML IJ SOLN
INTRAMUSCULAR | Status: AC
  Filled 2020-09-09: qty 2

## 2020-09-09 MED ORDER — FENTANYL CITRATE (PF) 100 MCG/2ML IJ SOLN
25.0000 ug | INTRAMUSCULAR | Status: DC | PRN
  Administered 2020-09-09: 50 ug via INTRAVENOUS

## 2020-09-09 MED ORDER — MEPERIDINE HCL 25 MG/ML IJ SOLN: 6.2500 mg | INTRAMUSCULAR | Status: DC | PRN

## 2020-09-09 MED ORDER — LACTATED RINGERS IV SOLN: INTRAVENOUS | Status: DC

## 2020-09-09 MED ORDER — ACETAMINOPHEN 160 MG/5ML PO SOLN: 325.0000 mg | ORAL | Status: DC | PRN

## 2020-09-09 MED ORDER — GLYCOPYRROLATE PF 0.2 MG/ML IJ SOSY
PREFILLED_SYRINGE | INTRAMUSCULAR | Status: AC
  Filled 2020-09-09: qty 1

## 2020-09-09 MED ORDER — DEXAMETHASONE SODIUM PHOSPHATE 10 MG/ML IJ SOLN
INTRAMUSCULAR | Status: AC
  Filled 2020-09-09: qty 1

## 2020-09-09 MED ORDER — SUGAMMADEX SODIUM 200 MG/2ML IV SOLN
INTRAVENOUS | Status: DC | PRN
  Administered 2020-09-09: 200 mg via INTRAVENOUS

## 2020-09-09 MED ORDER — ACETAMINOPHEN 325 MG PO TABS: 325.0000 mg | ORAL_TABLET | ORAL | Status: DC | PRN

## 2020-09-09 MED ORDER — FENTANYL CITRATE (PF) 100 MCG/2ML IJ SOLN
INTRAMUSCULAR | Status: AC
  Filled 2020-09-09: qty 2

## 2020-09-09 MED ORDER — KETOROLAC TROMETHAMINE 30 MG/ML IJ SOLN
INTRAMUSCULAR | Status: DC | PRN
  Administered 2020-09-09: 30 mg via INTRAVENOUS

## 2020-09-09 MED ORDER — FENTANYL CITRATE (PF) 250 MCG/5ML IJ SOLN
INTRAMUSCULAR | Status: AC
  Filled 2020-09-09: qty 5

## 2020-09-09 MED ORDER — BUPIVACAINE HCL (PF) 0.25 % IJ SOLN
INTRAMUSCULAR | Status: DC | PRN
  Administered 2020-09-09: 20 mL

## 2020-09-09 MED ORDER — MIDAZOLAM HCL 5 MG/5ML IJ SOLN
INTRAMUSCULAR | Status: DC | PRN
  Administered 2020-09-09: 2 mg via INTRAVENOUS

## 2020-09-09 MED ORDER — OXYCODONE HCL 5 MG PO TABS: 5.0000 mg | ORAL_TABLET | Freq: Once | ORAL | Status: DC | PRN

## 2020-09-09 MED ORDER — KETOROLAC TROMETHAMINE 30 MG/ML IJ SOLN
INTRAMUSCULAR | Status: AC
  Filled 2020-09-09: qty 1

## 2020-09-09 MED ORDER — ONDANSETRON HCL 4 MG/2ML IJ SOLN
INTRAMUSCULAR | Status: DC | PRN
  Administered 2020-09-09: 4 mg via INTRAVENOUS

## 2020-09-09 MED ORDER — GLYCOPYRROLATE 0.2 MG/ML IJ SOLN
INTRAMUSCULAR | Status: DC | PRN
  Administered 2020-09-09: .2 mg via INTRAVENOUS

## 2020-09-09 MED ORDER — DEXMEDETOMIDINE (PRECEDEX) IN NS 20 MCG/5ML (4 MCG/ML) IV SYRINGE
PREFILLED_SYRINGE | INTRAVENOUS | Status: AC
  Filled 2020-09-09: qty 5

## 2020-09-09 MED ORDER — LIDOCAINE HCL (PF) 2 % IJ SOLN
INTRAMUSCULAR | Status: AC
  Filled 2020-09-09: qty 10

## 2020-09-09 MED ORDER — HYDROCODONE-ACETAMINOPHEN 5-325 MG PO TABS
ORAL_TABLET | ORAL | Status: AC
  Filled 2020-09-09: qty 2

## 2020-09-09 MED ORDER — POVIDONE-IODINE 10 % EX SWAB: 2.0000 "application " | Freq: Once | CUTANEOUS | Status: DC

## 2020-09-09 MED ORDER — CEFAZOLIN SODIUM-DEXTROSE 2-4 GM/100ML-% IV SOLN
INTRAVENOUS | Status: AC
  Filled 2020-09-09: qty 100

## 2020-09-09 MED ORDER — PROPOFOL 10 MG/ML IV BOLUS
INTRAVENOUS | Status: AC
  Filled 2020-09-09: qty 20

## 2020-09-09 MED ORDER — ONDANSETRON HCL 4 MG/2ML IJ SOLN: 4.0000 mg | Freq: Once | INTRAMUSCULAR | Status: DC | PRN

## 2020-09-09 MED ORDER — LIDOCAINE HCL (CARDIAC) PF 100 MG/5ML IV SOSY
PREFILLED_SYRINGE | INTRAVENOUS | Status: DC | PRN
  Administered 2020-09-09: 60 mg via INTRAVENOUS

## 2020-09-09 MED ORDER — ONDANSETRON HCL 4 MG/2ML IJ SOLN
INTRAMUSCULAR | Status: AC
  Filled 2020-09-09: qty 2

## 2020-09-09 MED ORDER — ROCURONIUM BROMIDE 10 MG/ML (PF) SYRINGE
PREFILLED_SYRINGE | INTRAVENOUS | Status: AC
  Filled 2020-09-09: qty 10

## 2020-09-09 MED ORDER — CEFAZOLIN SODIUM-DEXTROSE 2-4 GM/100ML-% IV SOLN
2.0000 g | INTRAVENOUS | Status: AC
  Administered 2020-09-09: 2 g via INTRAVENOUS

## 2020-09-09 MED ORDER — OXYCODONE-ACETAMINOPHEN 5-325 MG PO TABS
ORAL_TABLET | ORAL | Status: AC
  Filled 2020-09-09: qty 2

## 2020-09-09 MED ORDER — ACETAMINOPHEN 10 MG/ML IV SOLN
INTRAVENOUS | Status: AC
  Filled 2020-09-09: qty 100

## 2020-09-09 MED ORDER — DEXAMETHASONE SODIUM PHOSPHATE 4 MG/ML IJ SOLN
INTRAMUSCULAR | Status: DC | PRN
  Administered 2020-09-09: 5 mg via INTRAVENOUS

## 2020-09-09 MED ORDER — DEXMEDETOMIDINE (PRECEDEX) IN NS 20 MCG/5ML (4 MCG/ML) IV SYRINGE
PREFILLED_SYRINGE | INTRAVENOUS | Status: DC | PRN
  Administered 2020-09-09: 8 ug via INTRAVENOUS
  Administered 2020-09-09: 12 ug via INTRAVENOUS

## 2020-09-09 MED ORDER — ACETAMINOPHEN 325 MG PO TABS: 650.0000 mg | ORAL_TABLET | ORAL | Status: DC | PRN

## 2020-09-09 MED ORDER — ROCURONIUM BROMIDE 100 MG/10ML IV SOLN
INTRAVENOUS | Status: DC | PRN
  Administered 2020-09-09: 60 mg via INTRAVENOUS
  Administered 2020-09-09 (×2): 5 mg via INTRAVENOUS

## 2020-09-09 SURGICAL SUPPLY — 68 items
ADH SKN CLS APL DERMABOND .7 (GAUZE/BANDAGES/DRESSINGS) ×1
BARRIER ADHS 3X4 INTERCEED (GAUZE/BANDAGES/DRESSINGS) IMPLANT
BRR ADH 4X3 ABS CNTRL BYND (GAUZE/BANDAGES/DRESSINGS)
CATH FOLEY 3WAY  5CC 16FR (CATHETERS) ×2
CATH FOLEY 3WAY 5CC 16FR (CATHETERS) ×1 IMPLANT
COVER BACK TABLE 60X90IN (DRAPES) ×2 IMPLANT
COVER TIP SHEARS 8 DVNC (MISCELLANEOUS) ×1 IMPLANT
COVER TIP SHEARS 8MM DA VINCI (MISCELLANEOUS) ×2
DECANTER SPIKE VIAL GLASS SM (MISCELLANEOUS) ×4 IMPLANT
DEFOGGER SCOPE WARMER CLEARIFY (MISCELLANEOUS) ×2 IMPLANT
DERMABOND ADVANCED (GAUZE/BANDAGES/DRESSINGS) ×1
DERMABOND ADVANCED .7 DNX12 (GAUZE/BANDAGES/DRESSINGS) ×1 IMPLANT
DRAPE ARM DVNC X/XI (DISPOSABLE) ×4 IMPLANT
DRAPE COLUMN DVNC XI (DISPOSABLE) ×1 IMPLANT
DRAPE DA VINCI XI ARM (DISPOSABLE) ×8
DRAPE DA VINCI XI COLUMN (DISPOSABLE) ×2
DRAPE UTILITY XL STRL (DRAPES) ×2 IMPLANT
DURAPREP 26ML APPLICATOR (WOUND CARE) ×2 IMPLANT
ELECT REM PT RETURN 9FT ADLT (ELECTROSURGICAL) ×2
ELECTRODE REM PT RTRN 9FT ADLT (ELECTROSURGICAL) ×1 IMPLANT
GAUZE 4X4 16PLY ~~LOC~~+RFID DBL (SPONGE) ×4 IMPLANT
GAUZE PETROLATUM 1 X8 (GAUZE/BANDAGES/DRESSINGS) ×1 IMPLANT
GAUZE VASELINE 3X9 (GAUZE/BANDAGES/DRESSINGS) ×1 IMPLANT
GLOVE SRG 8 PF TXTR STRL LF DI (GLOVE) IMPLANT
GLOVE SURG ENC MOIS LTX SZ6.5 (GLOVE) ×6 IMPLANT
GLOVE SURG ENC MOIS LTX SZ8 (GLOVE) IMPLANT
GLOVE SURG UNDER POLY LF SZ7 (GLOVE) ×10 IMPLANT
GLOVE SURG UNDER POLY LF SZ8 (GLOVE)
GOWN STRL REUS W/TWL XL LVL3 (GOWN DISPOSABLE) IMPLANT
HOLDER FOLEY CATH W/STRAP (MISCELLANEOUS) IMPLANT
IRRIG SUCT STRYKERFLOW 2 WTIP (MISCELLANEOUS) ×2
IRRIGATION SUCT STRKRFLW 2 WTP (MISCELLANEOUS) ×1 IMPLANT
KIT TURNOVER CYSTO (KITS) ×2 IMPLANT
LEGGING LITHOTOMY PAIR STRL (DRAPES) ×2 IMPLANT
OBTURATOR OPTICAL STANDARD 8MM (TROCAR) ×2
OBTURATOR OPTICAL STND 8 DVNC (TROCAR) ×1
OBTURATOR OPTICALSTD 8 DVNC (TROCAR) ×1 IMPLANT
OCCLUDER COLPOPNEUMO (BALLOONS) ×2 IMPLANT
PACK ROBOT WH (CUSTOM PROCEDURE TRAY) ×2 IMPLANT
PACK ROBOTIC GOWN (GOWN DISPOSABLE) ×2 IMPLANT
PACK TRENDGUARD 450 HYBRID PRO (MISCELLANEOUS) IMPLANT
PAD OB MATERNITY 4.3X12.25 (PERSONAL CARE ITEMS) ×2 IMPLANT
PAD PREP 24X48 CUFFED NSTRL (MISCELLANEOUS) ×2 IMPLANT
POUCH ENDO CATCH II 15MM (MISCELLANEOUS) IMPLANT
PROTECTOR NERVE ULNAR (MISCELLANEOUS) ×4 IMPLANT
RTRCTR WOUND ALEXIS 18CM SML (INSTRUMENTS)
SAVER CELL AAL HAEMONETICS (INSTRUMENTS) IMPLANT
SEAL CANN UNIV 5-8 DVNC XI (MISCELLANEOUS) ×4 IMPLANT
SEAL XI 5MM-8MM UNIVERSAL (MISCELLANEOUS) ×8
SET IRRIG Y TYPE TUR BLADDER L (SET/KITS/TRAYS/PACK) IMPLANT
SET TRI-LUMEN FLTR TB AIRSEAL (TUBING) ×2 IMPLANT
SPONGE T-LAP 4X18 ~~LOC~~+RFID (SPONGE) ×2 IMPLANT
SUT ETHIBOND 0 (SUTURE) IMPLANT
SUT VIC AB 4-0 PS2 27 (SUTURE) ×6 IMPLANT
SUT VICRYL 0 UR6 27IN ABS (SUTURE) ×2 IMPLANT
SUT VLOC 180 0 6IN GS21 (SUTURE) ×1 IMPLANT
SUT VLOC 180 0 9IN  GS21 (SUTURE) ×2
SUT VLOC 180 0 9IN GS21 (SUTURE) ×1 IMPLANT
SUT VLOC 180 2-0 6IN GS21 (SUTURE) IMPLANT
TIP RUMI ORANGE 6.7MMX12CM (TIP) IMPLANT
TIP UTERINE 5.1X6CM LAV DISP (MISCELLANEOUS) IMPLANT
TIP UTERINE 6.7X10CM GRN DISP (MISCELLANEOUS) IMPLANT
TIP UTERINE 6.7X6CM WHT DISP (MISCELLANEOUS) IMPLANT
TIP UTERINE 6.7X8CM BLUE DISP (MISCELLANEOUS) ×1 IMPLANT
TOWEL OR 17X26 10 PK STRL BLUE (TOWEL DISPOSABLE) ×2 IMPLANT
TRENDGUARD 450 HYBRID PRO PACK (MISCELLANEOUS) ×2
TROCAR PORT AIRSEAL 5X120 (TROCAR) ×2 IMPLANT
WATER STERILE IRR 1000ML POUR (IV SOLUTION) ×2 IMPLANT

## 2020-09-09 NOTE — Anesthesia Preprocedure Evaluation (Signed)
Anesthesia Evaluation  Patient identified by MRN, date of birth, ID band Patient awake    Reviewed: Allergy & Precautions, NPO status , Patient's Chart, lab work & pertinent test results, reviewed documented beta blocker date and time   Airway Mallampati: II       Dental  (+) Partial Lower   Pulmonary neg pulmonary ROS,  COVID + 02/01/20   Pulmonary exam normal        Cardiovascular Exercise Tolerance: Good hypertension, Pt. on home beta blockers Normal cardiovascular exam     Neuro/Psych negative neurological ROS  negative psych ROS   GI/Hepatic negative GI ROS, Neg liver ROS,   Endo/Other  negative endocrine ROS  Renal/GU negative Renal ROS  negative genitourinary   Musculoskeletal negative musculoskeletal ROS (+)   Abdominal Normal abdominal exam  (+)   Peds negative pediatric ROS (+)  Hematology   Anesthesia Other Findings   Reproductive/Obstetrics Cervical dysplasia                             Anesthesia Physical  Anesthesia Plan  ASA: 2  Anesthesia Plan: General   Post-op Pain Management:    Induction:   PONV Risk Score and Plan: 4 or greater and Ondansetron, Dexamethasone, Midazolam and Treatment may vary due to age or medical condition  Airway Management Planned: Oral ETT  Additional Equipment: None  Intra-op Plan:   Post-operative Plan: Extubation in OR  Informed Consent: I have reviewed the patients History and Physical, chart, labs and discussed the procedure including the risks, benefits and alternatives for the proposed anesthesia with the patient or authorized representative who has indicated his/her understanding and acceptance.     Dental advisory given  Plan Discussed with: CRNA, Anesthesiologist and Surgeon  Anesthesia Plan Comments: (BP significantly elevated. Patient has no diagnosis of HTN. Is currently asymptomatic (no headache, blurry vision,  etc.). Had a thorough discussion with patient that this is elevated more than "nervousness" and that we will do her procedure today but she will need to followup within 1 week with a PCP. Will treat perioperatively with IV anti-hypertensives. Norton Blizzard, MD  )        Anesthesia Quick Evaluation

## 2020-09-09 NOTE — Anesthesia Postprocedure Evaluation (Signed)
Anesthesia Post Note  Patient: Alyssa [name]  Procedure(s) Performed: XI ROBOTIC ASSISTED TOTAL HYSTERECTOMY WITH BILATERAL SALPINGECTOMY (Abdomen)     Patient location during evaluation: PACU Anesthesia Type: General Level of consciousness: awake and sedated Pain management: pain level controlled Vital Signs Assessment: post-procedure vital signs reviewed and stable Respiratory status: spontaneous breathing Cardiovascular status: stable Postop Assessment: no apparent nausea or vomiting Anesthetic complications: no   No notable events documented.  Last Vitals:  Vitals:   09/09/20 1400 09/09/20 1415  BP: 131/90 129/88  Pulse: 69 70  Resp: 16 (!) 21  Temp:  36.6 C  SpO2: 100% 100%    Last Pain:  Vitals:   09/09/20 1400  TempSrc:   PainSc: [place]

## 2020-09-09 NOTE — Op Note (Signed)
Operative Note  09/09/2020  1:24 PM  PATIENT:  Alyssa [name]  45 y.o. female  PRE-OPERATIVE DIAGNOSIS:  FIBROIDS, PELVIC PAIN  POST-OPERATIVE DIAGNOSIS:  FIBROIDS, PELVIC PAIN  PROCEDURE:  Procedure(s): XI ROBOTIC ASSISTED TOTAL LAPAROSCOPIC HYSTERECTOMY WITH BILATERAL SALPINGECTOMY  SURGEON:  Surgeon(s): [name], MD  ANESTHESIA:   general  FINDINGS: Uterus with many subserosal and intramural fibroids.  Ovaries normal.  DESCRIPTION OF OPERATION: Under general anesthesia with endotracheal intubation, the patient is in lithotomy position.  She is prepped with DuraPrep on the abdomen and with Betadine on the suprapubic, vulvar and vaginal areas.  She is draped as usual.  Timeout is done.  The Foley is inserted in the bladder.  No gynecologic exam reveals an anteverted uterus with multiple fibroids, no adnexal mass.  The weighted speculum is inserted in the vagina and the anterior lip of the cervix is grasped with a tenaculum.  Hysterometry is at 9 cm.  Dilation of the cervix was done with [facility] dilators.  We used a #8 Rumi with the medium Koh ring.  Those are put in place in the uterus and at the cervix.  The other instruments are removed.  We go to the abdomen.  The supra umbilical area is infiltrated with Marcaine one quarter plain.  We make a 1.5 cm incision at that level with the scalpel.  The aponeurosis is grasped with cokers and opened with Mayo scissors under direct vision.  The parietal peritoneum is opened bluntly with the finger.  A pursestring stitch of Vicryl 0 is done on the aponeurosis.  The Sheryle Hail is inserted at that level under direct vision.  Pneumoperitoneum is created with CO2.  The camera is inserted for inspection.  The uterus presents many subserosal and pedunculated fibroids.  Smaller intramural fibroids are present.  Both ovaries are normal in size and appearance.  The abdominal wall is clear for port insertion.  We infiltrated the skin with Marcaine one quarter  plain at each port sites.  We make small incisions with the scalpel.  The robotic ports and the assistant port were inserted under direct vision with 2 robotic ports on the right side in line with the umbilicus and 1 robotic port on the left distal side in line with the umbilicus.  The 5 mm assistant port is inserted medially on the left side slightly higher than the umbilical line.  The patient is positioned in 30 degree Trendelenburg.  The robot is docked.  Targeting was done.  The robotic instruments are inserted under direct vision with the long bipolar in the first arm, the camera and the second arm, the scissors in the third arm and the pro grasp in the fourth arm.  We go to the console.      Both ureters are in normal anatomic position with good peristalsis.  We started on the left side with cauterization and section of the left mesosalpinx.  We then cauterized and sectioned the left utero-ovarian ligament.  We cauterized and sectioned the left round ligament.  The broad ligament is open on the left close to the uterus.  The visceral peritoneum is opened anteriorly and the bladder is distended.  We proceeded exactly the same way on the right side and further descend the bladder past the [place] ring.  We then cauterized the backflow on either side of the uterus.  The right uterine artery is cauterized and section.  The left uterine artery was cauterized and section.  The vaginal occluder is inflated.  We open the upper part of the vagina over the [facility] ring with the tip of the scissors anteriorly, then on either side and finally posteriorly.  The uterus is completely detached with the cervix and both tubes.  To facilitate the removal of the uterus vaginally, 2 subserosal fibroids are detached by sectioning the white base.  The uterus with the cervix and both tubes passes very well vaginally.  The 2 fibroids are successively passed vaginally.  The 3 specimens are sent to pathology together.  The vaginal occluder is  reinflated.  We confirmed good hemostasis at all levels.  The pelvis was irrigated and suctioned.  We changed instruments to the cutting needle driver in the third arm and the long tip in the first arm.  We used a V-Loc 0 at 9 inches to close the vaginal vault.  We started on the right side with a running suture all the way to the left angle and then back.  We used an additional 6 inches V-Loc 0 to completing the closure of the vaginal vault.  Both needles were removed from the pelvis.  Hemostasis was adequate at all levels.  The ureters showed good peristalsis.  Urine was clear.  All robotic instruments were removed.  The robot was undocked.  All ports were removed.  The CO2 was evacuated.  We attached a pursestring stitch at the supraumbilical incision.  We then closed all skin incision with a Vicryl 4-0 in a subcuticular stitch.  We added Dermabond on all incisions.  The vaginal occluder was removed.  The patient was brought to recovery room in good and stable status.  ESTIMATED BLOOD LOSS: 40 mL   Intake/Output Summary (Last 24 hours) at 09/09/2020 1324 Last data filed at 09/09/2020 1314 Gross per 24 hour  Intake 100 ml  Output 290 ml  Net -190 ml     BLOOD ADMINISTERED:none   LOCAL MEDICATIONS USED:  MARCAINE     SPECIMEN:  Source of Specimen:  Fibromatous uterus with cervix and bilateral tubes.    DISPOSITION OF SPECIMEN:  PATHOLOGY  COUNTS:  YES  PLAN OF CARE: Transfer to PACU  Marie-Lyne LavoieMD1:24 PMT

## 2020-09-09 NOTE — H&P (Signed)
Alyssa [name] is an 45 y.o. female. [name]    RP: XI RoboticTLH/Bilateral Salpingectomy 09/09/2020   HPI: Continued pelvic pain with BTB post Sonata.  No heavy menses anymore.    Pertinent Gynecological History: Menses:  Normal flow Contraception: condoms Blood transfusions: none STDs: HSV, HPV Previous GYN Procedures:  Sonata procedure   Last mammogram: normal  Last pap: normal  OB History: G1, P0A1   Menstrual History: Patient's last menstrual period was 09/03/2020 (exact date).    Past Medical History:  Diagnosis Date   Anemia due to blood loss, chronic    due to menorrhagia   Eczema    History of 2019 novel coronavirus disease (COVID-19) 02/01/2020   pt tested coivd positive , result in epic, which was done for surgery.  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   s/p cryo   History of herpes genitalis    History of kidney stones    last stone with surgery done 2007   History of sexually transmitted disease 2006   Hypertension    Menorrhagia    Uterine fibroid     Past Surgical History:  Procedure Laterality Date   CYSTOSCOPY WITH RETROGRADE PYELOGRAM, URETEROSCOPY AND STENT PLACEMENT  03-22-2005  '@[facility]'$    DILATATION AND CURETTAGE/HYSTEROSCOPY WITH MINERVA  02/15/2020   '@[facility]'$    EXTRACORPOREAL SHOCK WAVE LITHOTRIPSY  03/2005   GYNECOLOGIC CRYOSURGERY  2005   for fibroids   HYSTEROSCOPY WITH RESECTOSCOPE  11-04-2008  '@[facility]'$    MYOMECTOMY   MANDIBLE SURGERY Bilateral    REMOVAL CYST'S BILATERAL MANDIBLE AND FOUR TEETH EXTRACTION'S 01-30-2002  '@[facility]'$    UMBILICAL HERNIA REPAIR     10-01-1999  '@[facility]'$ , 2 hernia repair umbilical as child    Family History  Problem Relation Age of Onset   Diabetes Mother    Hypertension Mother    Kidney failure Mother    Breast cancer Maternal Aunt        Age 31's   Diabetes Maternal Grandmother    Hypertension Maternal Grandmother     Social History:   reports that she has never smoked. She has never used smokeless tobacco. She reports current alcohol use. She reports that she does not use drugs.  Allergies: No Known Allergies  Medications Prior to Admission  Medication Sig Dispense Refill Last Dose   amLODipine (NORVASC) 10 MG tablet Take 10 mg by mouth daily.   09/09/2020 at 0810   metoprolol succinate (TOPROL-XL) 25 MG 24 hr tablet Take 25 mg by mouth daily.   09/09/2020 at 0810    REVIEW OF SYSTEMS: A ROS was performed and pertinent positives and negatives are included in the history.  GENERAL: No fevers or chills. HEENT: No change in vision, no earache, sore throat or sinus congestion. NECK: No pain or stiffness. CARDIOVASCULAR: No chest pain or pressure. No palpitations. PULMONARY: No shortness of breath, cough or wheeze. GASTROINTESTINAL: No abdominal pain, nausea, vomiting or diarrhea, melena or bright red blood per rectum. GENITOURINARY: No urinary frequency, urgency, hesitancy or dysuria. MUSCULOSKELETAL: No joint or muscle pain, no back pain, no recent trauma. DERMATOLOGIC: No rash, no itching, no lesions. ENDOCRINE: No polyuria, polydipsia, no heat or cold intolerance. No recent change in weight. HEMATOLOGICAL: No anemia or easy bruising or bleeding. NEUROLOGIC: No headache, seizures, numbness, tingling or weakness. PSYCHIATRIC: No depression, no loss of interest in normal activity or change in sleep pattern.  Blood pressure (!) 144/103, pulse 73, temperature 98.7 F (37.1 C), temperature source Oral, resp. rate 14, height '5\' 5"'$  (1.651 m), weight 75.5 kg, last menstrual period 09/03/2020, SpO2 100 %.  Physical Exam:  RCR Lungs clear  Results for orders placed or performed during the hospital encounter of 09/09/20 (from the past 24 hour(s))  Pregnancy, urine POC     Status: None   Collection Time: 09/09/20  9:18 AM  Result Value Ref Range   Preg Test, Ur NEGATIVE NEGATIVE   Pelvic US 01/03/2020: T/V images.  Anteverted  uterus enlarged with multiple fibroids.  The overall uterine size is measured at 14.73 x 8.13 x 7.09 cm.  The largest fibroid is measured at 6.7 cm and is subserosal.  The other fibroids are intramural and subserosal measured at 6 cm, 4.9 cm, 5 cm, 2.3 cm and 2.3 cm.  The endometrial lining is measured at 7.43 mm and is distorted by the fibroids.  Both ovaries are normal in size with a follicular pattern.  No adnexal mass seen.  Mild clear fluid in the posterior cul-de-sac measured at 6.5 x 3.5 cm.   Sonata procedure 02/15/2020.     Assessment/Plan:  45 y.o. [name]    1. Fibroids Uterine fibroids. No longer experiencing heavy manses and overall size of the uterus with fibroid is decreased since Sonata procedure.  Given that patient is still experiencing breakthrough bleeding and pelvic discomfort, she requests hysterectomy.  Decision to proceed with XI TLH/Bilateral Salpingectomy on 09/09/2020.  Preop preparation, surgery and risks, as well as postop precautions and expectations thoroughly reviewed with patient.  Patient voiced understanding and agreement with plan.   2. Pelvic pain in female As above.   3. Yeast infection Tendency for yeast vaginitis post ABTx.  Fluconazole prescription sent to pharmacy for postop.   Other orders - amLODipine (NORVASC) 10 MG tablet; Take 10 mg by mouth daily. - fluconazole (DIFLUCAN) 150 MG tablet; Take 1 tablet (150 mg total) by mouth daily for 3 days.                          Patient was counseled as to the risk of surgery to include the following:   1. Infection (prohylactic antibiotics will be administered)   2. DVT/Pulmonary Embolism (prophylactic pneumo compression stockings will be used)   3.Trauma to internal organs requiring additional surgical procedure to repair any injury to internal organs requiring perhaps additional hospitalization days.   4.Hemmorhage requiring transfusion and blood products which carry risks such as anaphylactic reaction,  hepatitis and AIDS   Patient had received literature information on the procedure scheduled and all her questions were answered and fully accepts all risk.  Marie-Lyne Tulio Facundo 09/09/2020, 9:58 AM

## 2020-09-09 NOTE — Anesthesia Procedure Notes (Signed)
Procedure Name: Intubation Date/Time: 09/09/2020 10:44 AM Performed by: Bufford Spikes, CRNA Pre-anesthesia Checklist: Patient identified, Emergency Drugs available, Suction available and Patient being monitored Patient Re-evaluated:Patient Re-evaluated prior to induction Oxygen Delivery Method: Circle system utilized Preoxygenation: Pre-oxygenation with 100% oxygen Induction Type: IV induction Ventilation: Mask ventilation without difficulty Laryngoscope Size: Miller and 2 Grade View: Grade II Tube type: Oral Tube size: 7.0 mm Number of attempts: 1 Airway Equipment and Method: Stylet and Oral airway Placement Confirmation: ETT inserted through vocal cords under direct vision, positive ETCO2 and breath sounds checked- equal and bilateral Secured at: 21 cm Tube secured with: Tape Dental Injury: Teeth and Oropharynx as per pre-operative assessment

## 2020-09-09 NOTE — Transfer of Care (Signed)
Immediate Anesthesia Transfer of Care Note  Patient: Alyssa [name]  Procedure(s) Performed: Procedure(s) (LRB): XI ROBOTIC ASSISTED TOTAL HYSTERECTOMY WITH BILATERAL SALPINGECTOMY (N/A)  Patient Location: PACU  Anesthesia Type: General  Level of Consciousness: awake, oriented, sedated and patient cooperative  Airway & Oxygen Therapy: Patient Spontanous Breathing and Patient connected to face mask oxygen  Post-op Assessment: Report given to PACU RN and Post -op Vital signs reviewed and stable  Post vital signs: Reviewed and stable  Complications: No apparent anesthesia complications Last Vitals:  Vitals Value Taken Time  BP 134/89 09/09/20 1334  Temp    Pulse 70 09/09/20 1338  Resp 20 09/09/20 1338  SpO2 100 % 09/09/20 1338  Vitals shown include unvalidated device data.  Last Pain:  Vitals:   09/09/20 0949  TempSrc: Oral  PainSc: 0-No pain      Patients Stated Pain Goal: 0 ([id number])  Complications: No notable events documented.

## 2020-09-10 ENCOUNTER — Encounter (HOSPITAL_BASED_OUTPATIENT_CLINIC_OR_DEPARTMENT_OTHER): Payer: Self-pay | Admitting: Obstetrics & Gynecology

## 2020-09-10 MED ORDER — OXYCODONE-ACETAMINOPHEN 7.5-325 MG PO TABS: 1.0000 | ORAL_TABLET | Freq: Four times a day (QID) | ORAL | 0 refills | Status: DC | PRN

## 2020-09-10 NOTE — Progress Notes (Signed)
POD#1 XI Robotic TLH/Bilateral Salpingectomy  Subjective: Patient reports tolerating PO, + flatus, and no problems voiding.    Objective: I have reviewed patient's vital signs.  vital signs, medications, and labs.  Vitals:   09/10/20 0221 09/10/20 0647  BP: 130/84 130/83  Pulse: 66 80  Resp: 18 18  Temp: 98 F (36.7 C) 98 F (36.7 C)  SpO2: 100% 100%   I/O last 3 completed shifts: In: [id number] [P.O.:570; I.V.:1000; IV Piggyback:100] Out: [id number] [Urine:3150; Blood:40] No intake/output data recorded.  Results for orders placed or performed during the hospital encounter of 09/09/20 (from the past 24 hour(s))  Pregnancy, urine POC     Status: None   Collection Time: 09/09/20  9:18 AM  Result Value Ref Range   Preg Test, Ur NEGATIVE NEGATIVE  CBC     Status: Abnormal   Collection Time: 09/09/20  9:24 PM  Result Value Ref Range   WBC 9.7 4.0 - 10.5 K/uL   RBC 4.50 3.87 - 5.11 MIL/uL   Hemoglobin 11.8 (L) 12.0 - 15.0 g/dL   HCT 36.7 36.0 - 46.0 %   MCV 81.6 80.0 - 100.0 fL   MCH 26.2 26.0 - 34.0 pg   MCHC 32.2 30.0 - 36.0 g/dL   RDW 17.3 (H) 11.5 - 15.5 %   Platelets 341 150 - 400 K/uL   nRBC 0.0 0.0 - 0.2 %    EXAM General: alert, cooperative, and appears stated age Resp: clear to auscultation bilaterally Cardio: regular rate and rhythm GI: soft, non-tender; bowel sounds normal; no masses,  no organomegaly Extremities: no edema, redness or tenderness in the calves or thighs Vaginal Bleeding: none  Assessment: s/p Procedure(s): XI ROBOTIC ASSISTED TOTAL HYSTERECTOMY WITH BILATERAL SALPINGECTOMY: stable, progressing well, and tolerating diet  Plan: Discharge home  LOS: 0 days    [name], MD 09/10/2020 8:27 AM

## 2020-09-10 NOTE — Discharge Summary (Signed)
Physician Discharge Summary  Patient ID: Alyssa [name] MRN: [id number] DOB/AGE: 04/20/1975 45 y.o.  Admit date: 09/09/2020 Discharge date: 09/10/2020  Admission Diagnoses: Postoperative state [Z98.890]   Discharge Diagnoses:  Active Problems:   Postoperative state   Discharged Condition: good  Consults:None  Significant Diagnostic Studies: None  Treatments:surgery: XI Robotic TLH/Bilateral Salpingectomy  Vitals:   09/10/20 0647 09/10/20 0831  BP: 130/83 (!) 141/91  Pulse: 80 79  Resp: 18 18  Temp: 98 F (36.7 C) 98 F (36.7 C)  SpO2: 100% 100%     No intake/output data recorded.   Hospital Course: Good  Discharge Exam: Normal Postop  Disposition: Discharge disposition: 01-Home or Self Care        Allergies as of 09/10/2020   No Known Allergies      Medication List     TAKE these medications    amLODipine 10 MG tablet Commonly known as: NORVASC Take 10 mg by mouth daily.   metoprolol succinate 25 MG 24 hr tablet Commonly known as: TOPROL-XL Take 25 mg by mouth daily.   oxyCODONE-acetaminophen 7.5-325 MG tablet Commonly known as: Percocet Take 1 tablet by mouth every 6 (six) hours as needed for severe pain.          Follow-up Information     [name], MD Follow up in 2 week(s).   Specialty: Obstetrics and Gynecology Contact information: [address] [place] [address] [phone]                  Signed: [name] 09/10/2020, 9:14 AM

## 2020-09-11 LAB — SURGICAL PATHOLOGY

## 2020-09-16 ENCOUNTER — Encounter: Payer: Self-pay | Admitting: Obstetrics & Gynecology

## 2020-09-23 ENCOUNTER — Telehealth: Payer: Self-pay | Admitting: *Deleted

## 2020-09-23 NOTE — Telephone Encounter (Signed)
Per patient she states she told the nurse about the pain after surgery.   Where is the pain exactly? Location starts between eyes, ends on to the top of nose.  Knot size and location? Size of a pea.Location right side of nose in between eyes    Difficulty breathing? No   Nose bleeding? No  Only hurts when patient touches nose.   I will route this to Dr, Dellis Filbert for advice / recommendations.  Patient has an appointment 09/26/2020 for post op.

## 2020-09-23 NOTE — Telephone Encounter (Signed)
Patient called with complains of nose pain. Which occurred after surgery. Was wondering if anything was put over nose during surgery.Severity 6. Patient states there is a knot inside her nose.

## 2020-09-24 NOTE — Telephone Encounter (Signed)
Patient aware of recommendations.  

## 2020-09-26 ENCOUNTER — Ambulatory Visit (INDEPENDENT_AMBULATORY_CARE_PROVIDER_SITE_OTHER): Payer: BC Managed Care – PPO | Admitting: Obstetrics & Gynecology

## 2020-09-26 ENCOUNTER — Encounter: Payer: Self-pay | Admitting: Obstetrics & Gynecology

## 2020-09-26 ENCOUNTER — Other Ambulatory Visit: Payer: Self-pay

## 2020-09-26 VITALS — BP 138/94 | HR 82 | Resp 16

## 2020-09-26 DIAGNOSIS — Z09 Encounter for follow-up examination after completed treatment for conditions other than malignant neoplasm: Secondary | ICD-10-CM

## 2020-09-26 NOTE — Progress Notes (Signed)
    Alyssa [name] October 31, 1975 [id number]        45 y.o.  [name]   RP: Post XI Robotic TLH/Bilateral Salpingectomy on 09/09/2020  HPI: Feeling better every day.  Still feeling bloated, but better since she had a good BM.  No vaginal bleeding or d/c currently.  Urine normal.  No fever.    OB History  Gravida Para Term Preterm AB Living  1       1 0  SAB IAB Ectopic Multiple Live Births               # Outcome Date GA Lbr Len/2nd Weight Sex Delivery Anes PTL Lv  1 AB             Past medical history,surgical history, problem list, medications, allergies, family history and social history were all reviewed and documented in the EPIC chart.   Directed ROS with pertinent positives and negatives documented in the history of present illness/assessment and plan.  Exam:  Vitals:   09/26/20 1526  BP: (!) 138/94  Pulse: 82  Resp: 16   General appearance:  Normal  Abdomen: Incisions closed, no erythema, no drainage.  Gynecologic exam: Vulva normal.  Speculum:  Vaginal vault closed.  No bleeding, no discharge.  Patho 09/09/2020: FINAL MICROSCOPIC DIAGNOSIS:   A. UTERUS, CERVIX, BILATERAL FALLOPIAN TUBES, HYSTERECTOMY AND  SALPINGECTOMY:  - Uterus with benign leiomyomata  - Benign inactive endometrium  - Benign unremarkable cervix  - Benign unremarkable bilateral fallopian tubes  - No evidence of malignancy    Assessment/Plan:  45 y.o. [name]   1. Status post gynecological surgery, follow-up exam  Good postop healing.  No CI.  Recommendations to improve passage of gas discussed.  Recommend increasing walking at this time.  Will use vaseline on the incisions to dissolve the glue.  F/U in 4 weeks.  [name] MD, 3:40 PM 09/26/2020

## 2020-10-22 ENCOUNTER — Ambulatory Visit: Payer: BC Managed Care – PPO | Admitting: Obstetrics & Gynecology

## 2020-10-22 ENCOUNTER — Ambulatory Visit (INDEPENDENT_AMBULATORY_CARE_PROVIDER_SITE_OTHER): Payer: BC Managed Care – PPO | Admitting: Obstetrics & Gynecology

## 2020-10-22 ENCOUNTER — Other Ambulatory Visit: Payer: Self-pay

## 2020-10-22 ENCOUNTER — Encounter: Payer: Self-pay | Admitting: Obstetrics & Gynecology

## 2020-10-22 VITALS — BP 124/74

## 2020-10-22 DIAGNOSIS — S301XXA Contusion of abdominal wall, initial encounter: Secondary | ICD-10-CM

## 2020-10-22 DIAGNOSIS — Z09 Encounter for follow-up examination after completed treatment for conditions other than malignant neoplasm: Secondary | ICD-10-CM

## 2020-10-22 NOTE — Progress Notes (Signed)
    Mariaceleste Herrera Ishler Aug 18, 1975 [id number]        44 y.o.  [name]   RP: PO XI Robotic TLH/Bilateral Salpingectomy 09/09/2020  HPI: Tender at belly button incision, but improving, not draining anymore.  No pelvic pain.  No vaginal bleeding.  No fever.   OB History  Gravida Para Term Preterm AB Living  1       1 0  SAB IAB Ectopic Multiple Live Births               # Outcome Date GA Lbr Len/2nd Weight Sex Delivery Anes PTL Lv  1 AB             Past medical history,surgical history, problem list, medications, allergies, family history and social history were all reviewed and documented in the EPIC chart.   Directed ROS with pertinent positives and negatives documented in the history of present illness/assessment and plan.  Exam:  Vitals:   10/22/20 1448  BP: 124/74   General appearance:  Normal  Abdomen: Abdomen soft, nontender.  Incisions well closed with no erythema.  Supraumbilical incision is tender with crusted material and glue remaining.  Incision cleaned with peroxide and Neosporin ointment applied.  Covered with gauze.  Gynecologic exam: Vulva normal.  Bimanual exam: Vaginal vault nontender and soft, well closed.  No pelvic mass or tenderness.   Assessment/Plan:  45 y.o. [name]   1. Status post gynecological surgery, follow-up exam  Good postop evolution without complication except for slow healing of the supraumbilical incision.  Patient probably had a seroma which drained recently.  The incision was closed today but tender.  No evidence of infection.  Patient will clean with peroxide.  Follow-up in 1 week to reassess.  Letter to go back to work given to patient.  2. umbilical incision seroma, initial encounter  Supraumbilical incision drained recently, incision is closed today.  No erythema, but tenderness remains.  No evidence of infection.  Wound cleaning done.  Patient will continue with cleaning using peroxide.  Will reassess in 1 week.  [name] MD,  3:24 PM 10/22/2020

## 2020-10-26 ENCOUNTER — Encounter: Payer: Self-pay | Admitting: Obstetrics & Gynecology

## 2020-10-27 ENCOUNTER — Ambulatory Visit (INDEPENDENT_AMBULATORY_CARE_PROVIDER_SITE_OTHER): Payer: BC Managed Care – PPO | Admitting: Obstetrics and Gynecology

## 2020-10-27 ENCOUNTER — Other Ambulatory Visit: Payer: Self-pay

## 2020-10-27 ENCOUNTER — Encounter: Payer: Self-pay | Admitting: Obstetrics and Gynecology

## 2020-10-27 VITALS — BP 136/94 | HR 83 | Ht 62.0 in | Wt 170.0 lb

## 2020-10-27 DIAGNOSIS — T8149XA Infection following a procedure, other surgical site, initial encounter: Secondary | ICD-10-CM

## 2020-10-27 MED ORDER — FLUCONAZOLE 150 MG PO TABS: 150.0000 mg | ORAL_TABLET | Freq: Once | ORAL | 0 refills | Status: DC

## 2020-10-27 MED ORDER — IBUPROFEN 800 MG PO TABS: 800.0000 mg | ORAL_TABLET | Freq: Three times a day (TID) | ORAL | 0 refills | Status: DC | PRN

## 2020-10-27 MED ORDER — AMOXICILLIN-POT CLAVULANATE 875-125 MG PO TABS: 1.0000 | ORAL_TABLET | Freq: Two times a day (BID) | ORAL | 0 refills | Status: DC

## 2020-10-27 NOTE — Progress Notes (Signed)
GYNECOLOGY  VISIT   HPI: 45 y.o.   Single  African American  female   [name] with Patient's last menstrual period was 09/03/2020 (exact date).   here for 7 weeks status post   XI ROBOTIC ASSISTED TOTAL HYSTERECTOMY WITH BILATERAL SALPINGECTOMY (Abdomen) for fibroids and pelvic pain.   Patient complaining of white and bloody drainage from umbilical incision. Seen 5 days ago and had cleansing of the incision and remaining skin suture removed. An area looked like a pimple.  She told to use peroxide and then place Vaseline on it.   The incision is painful.  No fever.  She is worried about keloid formation.   GYNECOLOGIC HISTORY: Patient's last menstrual period was 09/03/2020 (exact date). Contraception: Hyst/Bil.salpingectomy Menopausal hormone therapy:  None Last mammogram:  07-29-20 3D/Neg/BiRads1 Last pap smear:  12-20-19 Neg, 11-28-15 Neg:Neg HR HPV, 10-22-14 Neg:Neg HR HPV        OB History     Gravida  1   Para      Term      Preterm      AB  1   Living  0      SAB      IAB      Ectopic      Multiple      Live Births                 Patient Active Problem List   Diagnosis Date Noted   Postoperative state 09/09/2020   Fibroid    HSV infection    STD (sexually transmitted disease) 08/18/2004   CIN I (cervical intraepithelial neoplasia I) 02/19/2003    Past Medical History:  Diagnosis Date   Anemia due to blood loss, chronic    due to menorrhagia   Eczema    History of 2019 novel coronavirus disease (COVID-19) 02/01/2020   pt tested coivd positive , result in epic, which was done for surgery.  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   s/p cryo   History of herpes genitalis    History of kidney stones    last stone with surgery done 2007   History of sexually transmitted disease 2006   Hypertension    Menorrhagia    Uterine fibroid     Past Surgical  History:  Procedure Laterality Date   CYSTOSCOPY WITH RETROGRADE PYELOGRAM, URETEROSCOPY AND STENT PLACEMENT  03-22-2005  @[facility]    DILATATION AND CURETTAGE/HYSTEROSCOPY WITH MINERVA  02/15/2020   @[facility]    EXTRACORPOREAL SHOCK WAVE LITHOTRIPSY  03/2005   GYNECOLOGIC CRYOSURGERY  2005   for fibroids   HYSTEROSCOPY WITH RESECTOSCOPE  11-04-2008  @[facility]    MYOMECTOMY   MANDIBLE SURGERY Bilateral    REMOVAL CYST'S BILATERAL MANDIBLE AND FOUR TEETH EXTRACTION'S 01-30-2002  @[facility]    ROBOTIC ASSISTED TOTAL HYSTERECTOMY N/A 09/09/2020   Procedure: XI ROBOTIC ASSISTED TOTAL HYSTERECTOMY WITH BILATERAL SALPINGECTOMY;  Surgeon: [name], MD;  Location: [facility];  Service: Gynecology;  Laterality: N/A;   UMBILICAL HERNIA REPAIR     10-01-1999  @[facility] , 2 hernia repair umbilical as child    Current Outpatient Medications  Medication Sig Dispense Refill   amLODipine (NORVASC) 10 MG tablet Take 10 mg by mouth daily.     metoprolol succinate (TOPROL-XL) 25 MG 24 hr tablet Take 25 mg by mouth daily.     No current facility-administered medications for this visit.  ALLERGIES: Patient has no known allergies.  Family History  Problem Relation Age of Onset   Diabetes Mother    Hypertension Mother    Kidney failure Mother    Breast cancer Maternal Aunt        Age 19's   Diabetes Maternal Grandmother    Hypertension Maternal Grandmother     Social History   Socioeconomic History   Marital status: Single    Spouse name: Not on file   Number of children: Not on file   Years of education: Not on file   Highest education level: Not on file  Occupational History   Not on file  Tobacco Use   Smoking status: Never   Smokeless tobacco: Never  Vaping Use   Vaping Use: Never used  Substance and Sexual Activity   Alcohol use: Yes    Comment: occ wine   Drug use: Never   Sexual activity: Not Currently    Partners: Male    Birth control/protection: Surgical    Comment: 1st  intercourse 45 yo-Fewer than 5 partners, hysterectomy  Other Topics Concern   Not on file  Social History Narrative   Not on file   Social Determinants of Health   Financial Resource Strain: Not on file  Food Insecurity: Not on file  Transportation Needs: Not on file  Physical Activity: Not on file  Stress: Not on file  Social Connections: Not on file  Intimate Partner Violence: Not on file    Review of Systems  Skin:        Drainage from surgery incision  All other systems reviewed and are negative.  PHYSICAL EXAMINATION:    BP (!) 136/94   Pulse 83   Ht 5\' 2"  (1.575 m)   Wt 170 lb (77.1 kg)   LMP 09/03/2020 (Exact Date)   SpO2 97%   BMI 31.09 kg/m     General appearance: alert, cooperative and appears stated age   Abdomen: umbilical incision with small 2 mm white area with surrounding incisional edema.  Very tender.  No erythema.   Abdomen is soft, non-tender, no masses,  no organomegaly   Incision cleansed with H2O2 and sterile gauze dressing placed.   Chaperone was present for exam:  Estill Bamberg, CMA  ASSESSMENT  Cellulitis of umbilical incision.   PLAN  Augmentin 875 mg bid x 7 days.  Diflucan 150 mg po x 1 prn with repeat at end of course of abx if needed. Motrin 800 mg every 8 hours prn.  Try heat or cold pack. Soap and water, peroxide if needed. No more Vaseline. FU in 2 - 3 days with Dr. Dellis Filbert.   An After Visit Summary was printed and given to the patient.

## 2020-10-28 NOTE — Telephone Encounter (Signed)
Pt was seen 10/27/2020 regarding this and was treated.

## 2020-10-28 NOTE — Telephone Encounter (Signed)
Spoke with patient over phone she was seen by Dr. Quincy Simmonds and is aware of recommendations

## 2020-10-30 ENCOUNTER — Encounter: Payer: Self-pay | Admitting: Obstetrics & Gynecology

## 2020-10-31 ENCOUNTER — Ambulatory Visit (INDEPENDENT_AMBULATORY_CARE_PROVIDER_SITE_OTHER): Payer: BC Managed Care – PPO | Admitting: Obstetrics & Gynecology

## 2020-10-31 ENCOUNTER — Other Ambulatory Visit: Payer: Self-pay

## 2020-10-31 ENCOUNTER — Encounter: Payer: Self-pay | Admitting: Obstetrics & Gynecology

## 2020-10-31 VITALS — BP 120/80

## 2020-10-31 DIAGNOSIS — T8149XA Infection following a procedure, other surgical site, initial encounter: Secondary | ICD-10-CM

## 2020-10-31 NOTE — Progress Notes (Signed)
    Alyssa [name] 01/01/1976 [id number]        45 y.o.  [name]   RP: Incision infection check  HPI: Seen by Dr Quincy Simmonds on 10/27/2020 and started on Augmentin.  Incision still tender, but improving on ABTx.  No discharge.  No redness.  No fever.   OB History  Gravida Para Term Preterm AB Living  1       1 0  SAB IAB Ectopic Multiple Live Births               # Outcome Date GA Lbr Len/2nd Weight Sex Delivery Anes PTL Lv  1 AB             Past medical history,surgical history, problem list, medications, allergies, family history and social history were all reviewed and documented in the EPIC chart.   Directed ROS with pertinent positives and negatives documented in the history of present illness/assessment and plan.  Exam:  Vitals:   10/31/20 0851  BP: 120/80   General appearance:  Normal  Abdomen: Supraumbilical incision:  Small (about 4 mm) tender superficial area of induration at the Rt aspect of the incision.  No erythema.  No discharge.    Gynecologic exam: Deferred   Assessment/Plan:  45 y.o. [name]   1. Incisional infection  Superficial incisional infection improving on Augmentin.  Will finish the ABTx and F/U next Tuesday 10/17th to reassess.  [name] MD, 9:23 AM 10/31/2020

## 2020-11-04 ENCOUNTER — Ambulatory Visit (INDEPENDENT_AMBULATORY_CARE_PROVIDER_SITE_OTHER): Payer: BC Managed Care – PPO | Admitting: Obstetrics & Gynecology

## 2020-11-04 ENCOUNTER — Other Ambulatory Visit: Payer: Self-pay

## 2020-11-04 ENCOUNTER — Encounter: Payer: Self-pay | Admitting: Obstetrics & Gynecology

## 2020-11-04 VITALS — BP 160/100

## 2020-11-04 DIAGNOSIS — T8149XA Infection following a procedure, other surgical site, initial encounter: Secondary | ICD-10-CM

## 2020-11-04 MED ORDER — FLUCONAZOLE 150 MG PO TABS: 150.0000 mg | ORAL_TABLET | Freq: Every day | ORAL | 1 refills | Status: DC

## 2020-11-04 MED ORDER — CEPHALEXIN 500 MG PO CAPS: 500.0000 mg | ORAL_CAPSULE | Freq: Two times a day (BID) | ORAL | 0 refills | Status: AC

## 2020-11-04 NOTE — Progress Notes (Signed)
    Alyssa [name] Aug 17, 1975 [id number]        45 y.o.  [name]   RP: Check on incisional infection  HPI: Supraumbilical incision still hard and tender at the little bump on the right.  Finishing Augmentin x 7 days.   OB History  Gravida Para Term Preterm AB Living  1       1 0  SAB IAB Ectopic Multiple Live Births               # Outcome Date GA Lbr Len/2nd Weight Sex Delivery Anes PTL Lv  1 AB             Past medical history,surgical history, problem list, medications, allergies, family history and social history were all reviewed and documented in the EPIC chart.   Directed ROS with pertinent positives and negatives documented in the history of present illness/assessment and plan.  Exam:  Vitals:   11/04/20 1113  BP: (!) 160/100   General appearance:  Normal  Abdomen: Supraumbilical incision with superficial infection draining at the right.  Incision care, cleaning done under local anesthesia with Lidocaine 1%.  Betadine prep.  Cleaned with Peroxide.  Steri-strip and dressing applied.  Gynecologic exam: Deferred   Assessment/Plan:  45 y.o. [name]   1. Incisional infection Very small and superficial infection of the supraumbilical incision still draining pus.  Thorough cleaning done under local anesthesia with Betadine prep using peroxide.  Steri-Strip applied and dressing.  Decision to change antibiotic treatment to Keflex 500 mg capsule twice a day for 7 days.  Prescription sent to pharmacy.  Fluconazole prescribed to prevent or treat a yeast infection secondary to antibiotic therapy.  Follow-up in 1 week to reassess the wound.  Other orders - cephALEXin (KEFLEX) 500 MG capsule; Take 1 capsule (500 mg total) by mouth 2 (two) times daily for 7 days.   [name] MD, 11:16 AM 11/04/2020

## 2020-11-11 ENCOUNTER — Encounter: Payer: Self-pay | Admitting: Obstetrics & Gynecology

## 2020-11-11 ENCOUNTER — Other Ambulatory Visit: Payer: Self-pay

## 2020-11-11 ENCOUNTER — Ambulatory Visit (INDEPENDENT_AMBULATORY_CARE_PROVIDER_SITE_OTHER): Payer: BC Managed Care – PPO | Admitting: Obstetrics & Gynecology

## 2020-11-11 VITALS — BP 110/68

## 2020-11-11 DIAGNOSIS — T8149XA Infection following a procedure, other surgical site, initial encounter: Secondary | ICD-10-CM

## 2020-11-11 NOTE — Progress Notes (Signed)
    Kathe Wirick Klinck Feb 04, 1975 [id number]        45 y.o.  [name]   RP: F/u supraumbilical incision infection  HPI: No longer seeing a bump.  No drainage.  No pain.  No fever.  Feels that her abdominal circumference is larger than before, a little bloated but normal gas and BMs.  Urine normal.   OB History  Gravida Para Term Preterm AB Living  1       1 0  SAB IAB Ectopic Multiple Live Births               # Outcome Date GA Lbr Len/2nd Weight Sex Delivery Anes PTL Lv  1 AB             Past medical history,surgical history, problem list, medications, allergies, family history and social history were all reviewed and documented in the EPIC chart.   Directed ROS with pertinent positives and negatives documented in the history of present illness/assessment and plan.  Exam:  Vitals:   11/11/20 1033  BP: 110/68   General appearance:  Normal  Abdomen: Incisions all well closed.  Supraumbilical incision:  No erythema, no induration.  Skin closed.  Peroxide applied, no bubble reaction.  Gynecologic exam: Deferred   Assessment/Plan:  45 y.o. [name]   1. Incisional infection Completely healed.  Closed with no sign of infection.  Patient reassured.  Satisfied.  Feels that her abdominal circumference is larger than before.  Recommend monitoring the kind of food that causes increased gas, lower calorie/carb diet and starting back on crunches and leg lifts to reinforce her abdominal muscles starting in a week from now.  Will f/u with Annual Gyn visits every year and Pap tests every 5 years.    [name] MD, 11:10 AM 11/11/2020

## 2020-12-22 ENCOUNTER — Ambulatory Visit: Payer: BC Managed Care – PPO | Admitting: Obstetrics & Gynecology

## 2021-02-03 ENCOUNTER — Encounter: Payer: Self-pay | Admitting: Obstetrics & Gynecology

## 2021-03-17 ENCOUNTER — Other Ambulatory Visit: Payer: Self-pay

## 2021-03-17 DIAGNOSIS — B009 Herpesviral infection, unspecified: Secondary | ICD-10-CM

## 2021-03-17 DIAGNOSIS — Z8619 Personal history of other infectious and parasitic diseases: Secondary | ICD-10-CM

## 2021-03-17 MED ORDER — VALACYCLOVIR HCL 500 MG PO TABS: ORAL_TABLET | ORAL | 0 refills | Status: DC

## 2021-03-17 NOTE — Telephone Encounter (Signed)
Pt called in requesting refill on valtrex.  Last AEX 12/20/19--nothing scheduled.  Mammo UTD.

## 2021-04-21 ENCOUNTER — Encounter: Payer: Self-pay | Admitting: Obstetrics & Gynecology

## 2021-04-21 DIAGNOSIS — B009 Herpesviral infection, unspecified: Secondary | ICD-10-CM

## 2021-04-21 MED ORDER — VALACYCLOVIR HCL 500 MG PO TABS
ORAL_TABLET | ORAL | 0 refills | Status: DC
Start: 2021-04-21 — End: 2022-03-29

## 2021-06-16 ENCOUNTER — Other Ambulatory Visit: Payer: Self-pay | Admitting: Internal Medicine

## 2021-06-16 DIAGNOSIS — Z1231 Encounter for screening mammogram for malignant neoplasm of breast: Secondary | ICD-10-CM

## 2021-07-30 ENCOUNTER — Ambulatory Visit
Admission: RE | Admit: 2021-07-30 | Discharge: 2021-07-30 | Disposition: A | Discharge status: Home / Self Care | Payer: BC Managed Care – PPO | Source: Ambulatory Visit | Attending: Internal Medicine | Admitting: Internal Medicine

## 2021-07-30 DIAGNOSIS — Z1231 Encounter for screening mammogram for malignant neoplasm of breast: Secondary | ICD-10-CM

## 2021-08-07 ENCOUNTER — Ambulatory Visit: Payer: BC Managed Care – PPO | Admitting: Radiology

## 2021-08-07 ENCOUNTER — Encounter (HOSPITAL_COMMUNITY): Payer: Self-pay

## 2021-08-07 ENCOUNTER — Emergency Department (HOSPITAL_COMMUNITY)
Admission: EM | Admit: 2021-08-07 | Discharge: 2021-08-07 | Disposition: A | Discharge status: Home / Self Care | Payer: BC Managed Care – PPO | Attending: Emergency Medicine | Admitting: Emergency Medicine

## 2021-08-07 ENCOUNTER — Other Ambulatory Visit: Payer: Self-pay

## 2021-08-07 VITALS — BP 220/126

## 2021-08-07 DIAGNOSIS — I169 Hypertensive crisis, unspecified: Secondary | ICD-10-CM | POA: Diagnosis not present

## 2021-08-07 DIAGNOSIS — R350 Frequency of micturition: Secondary | ICD-10-CM

## 2021-08-07 DIAGNOSIS — I10 Essential (primary) hypertension: Secondary | ICD-10-CM | POA: Diagnosis present

## 2021-08-07 DIAGNOSIS — L749 Eccrine sweat disorder, unspecified: Secondary | ICD-10-CM | POA: Diagnosis not present

## 2021-08-07 LAB — URINALYSIS, COMPLETE W/RFL CULTURE
Bacteria, UA: NONE SEEN /HPF
Bilirubin Urine: NEGATIVE
Casts: NONE SEEN /LPF
Crystals: NONE SEEN /HPF
Glucose, UA: NEGATIVE
Hyaline Cast: NONE SEEN /LPF
Ketones, ur: NEGATIVE
Leukocyte Esterase: NEGATIVE
Nitrites, Initial: NEGATIVE
Protein, ur: NEGATIVE
RBC / HPF: NONE SEEN /HPF (ref 0–2)
Specific Gravity, Urine: 1.01 (ref 1.001–1.035)
WBC, UA: NONE SEEN /HPF (ref 0–5)
Yeast: NONE SEEN /HPF
pH: 7 (ref 5.0–8.0)

## 2021-08-07 LAB — CBC WITH DIFFERENTIAL/PLATELET
Abs Immature Granulocytes: 0.01 10*3/uL (ref 0.00–0.07)
Basophils Absolute: 0 10*3/uL (ref 0.0–0.1)
Basophils Relative: 0 %
Eosinophils Absolute: 0.2 10*3/uL (ref 0.0–0.5)
Eosinophils Relative: 2 %
HCT: 39 % (ref 36.0–46.0)
Hemoglobin: 13.5 g/dL (ref 12.0–15.0)
Immature Granulocytes: 0 %
Lymphocytes Relative: 49 %
Lymphs Abs: 3 10*3/uL (ref 0.7–4.0)
MCH: 29.9 pg (ref 26.0–34.0)
MCHC: 34.6 g/dL (ref 30.0–36.0)
MCV: 86.5 fL (ref 80.0–100.0)
Monocytes Absolute: 0.5 10*3/uL (ref 0.1–1.0)
Monocytes Relative: 8 %
Neutro Abs: 2.5 10*3/uL (ref 1.7–7.7)
Neutrophils Relative %: 41 %
Platelets: 348 10*3/uL (ref 150–400)
RBC: 4.51 MIL/uL (ref 3.87–5.11)
RDW: 13.3 % (ref 11.5–15.5)
WBC: 6.2 10*3/uL (ref 4.0–10.5)
nRBC: 0 % (ref 0.0–0.2)

## 2021-08-07 LAB — WET PREP FOR TRICH, YEAST, CLUE

## 2021-08-07 LAB — BASIC METABOLIC PANEL
Anion gap: 6 (ref 5–15)
BUN: 10 mg/dL (ref 6–20)
CO2: 28 mmol/L (ref 22–32)
Calcium: 9.1 mg/dL (ref 8.9–10.3)
Chloride: 106 mmol/L (ref 98–111)
Creatinine, Ser: 0.79 mg/dL (ref 0.44–1.00)
GFR, Estimated: >60 mL/min (ref >60)
Glucose, Bld: 92 mg/dL (ref 70–99)
Potassium: 3.3 mmol/L — ABNORMAL LOW (ref 3.5–5.1)
Sodium: 140 mmol/L (ref 135–145)

## 2021-08-07 LAB — NO CULTURE INDICATED

## 2021-08-07 MED ORDER — METOPROLOL SUCCINATE ER 25 MG PO TB24: 25.0000 mg | ORAL_TABLET | Freq: Every day | ORAL | 0 refills | Status: DC

## 2021-08-07 MED ORDER — AMLODIPINE BESYLATE 10 MG PO TABS: 10.0000 mg | ORAL_TABLET | Freq: Every day | ORAL | 0 refills | Status: DC

## 2021-08-07 MED ORDER — POTASSIUM CHLORIDE CRYS ER 20 MEQ PO TBCR
40.0000 meq | EXTENDED_RELEASE_TABLET | Freq: Once | ORAL | Status: AC
  Administered 2021-08-07: 40 meq via ORAL
  Filled 2021-08-07: qty 2

## 2021-08-07 NOTE — ED Triage Notes (Signed)
Pt reports she was told to come here due to elevated BP at gynecology office. Pt reports her BP has been intermittently high for a few months. Pt denies weakness, numbness, headache, dizziness, chest pain, and SHOB.

## 2021-08-07 NOTE — Discharge Instructions (Addendum)
Return for any problem.  ?

## 2021-08-07 NOTE — ED Provider Notes (Signed)
[facility] DEPT Provider Note   CSN: [id number] Arrival date & time: 08/07/21  1619     History  Chief Complaint  Patient presents with   Hypertension    Alyssa [name] is a 46 y.o. female.  46 year old female with prior medical history as detailed below.  Patient with long established history of hypertension.  Patient reports that she does not take antihypertensives currently.  She reports that she did not "feel like it."  Patient was seen by GYN earlier today for another issue.  She was noted to be hypertensive there.  She was advised to come to the ED for evaluation. Patient is without complaint of headache, weakness, visual change, chest pain, shortness of breath, other complaint.  Patient does have established PCP.  Patient reports that she has not checked her blood pressure " in months."  The history is provided by the patient and medical records.  Hypertension This is a chronic problem. The current episode started more than 1 week ago. The problem occurs rarely. The problem has not changed since onset.      Home Medications Prior to Admission medications   Medication Sig Start Date End Date Taking? Authorizing Provider  fluconazole (DIFLUCAN) 150 MG tablet Take 1 tablet (150 mg total) by mouth daily for 3 days. Yeast vaginitis prevention/treatment post Anti-biotherapy. Patient not taking: Reported on 11/11/2020 11/04/20 11/07/20  [name], MD  valACYclovir (VALTREX) 500 MG tablet Take 1 tablet (500 mg total) by mouth 2 (two) times daily for 5 days. With outbreak 04/21/21   [name], MD      Allergies    Patient has no known allergies.    Review of Systems   Review of Systems  All other systems reviewed and are negative.   Physical Exam Updated Vital Signs BP (!) 193/146   Pulse 67   Temp 98.8 F (37.1 C) (Oral)   Resp 20   LMP 09/03/2020 (Exact Date) Comment: Urine pregnancy test on 09/09/2020 negative   SpO2 100%  Physical Exam Vitals and nursing note reviewed.  Constitutional:      General: She is not in acute distress.    Appearance: Normal appearance. She is well-developed.  HENT:     Head: Normocephalic and atraumatic.  Eyes:     Conjunctiva/sclera: Conjunctivae normal.     Pupils: Pupils are equal, round, and reactive to light.  Cardiovascular:     Rate and Rhythm: Normal rate and regular rhythm.     Heart sounds: Normal heart sounds.  Pulmonary:     Effort: Pulmonary effort is normal. No respiratory distress.     Breath sounds: Normal breath sounds.  Abdominal:     General: There is no distension.     Palpations: Abdomen is soft.     Tenderness: There is no abdominal tenderness.  Musculoskeletal:        General: No deformity. Normal range of motion.     Cervical back: Normal range of motion and neck supple.  Skin:    General: Skin is warm and dry.  Neurological:     General: No focal deficit present.     Mental Status: She is alert and oriented to person, place, and time.     ED Results / Procedures / Treatments   Labs (all labs ordered are listed, but only abnormal results are displayed) Labs Reviewed  CBC WITH DIFFERENTIAL/PLATELET  BASIC METABOLIC PANEL    EKG EKG Interpretation  Date/Time:  Friday August 07 2021  17:42:36 EDT Ventricular Rate:  65 PR Interval:  157 QRS Duration: 98 QT Interval:  452 QTC Calculation: 470 R Axis:   22 Text Interpretation: Sinus rhythm Low voltage, precordial leads Confirmed by Dene Gentry [phone]) on 08/07/2021 5:52:09 PM  Radiology No results found.  Procedures Procedures    Medications Ordered in ED Medications - No data to display  ED Course/ Medical Decision Making/ A&P                           Medical Decision Making Amount and/or Complexity of Data Reviewed Labs: ordered.  Risk Prescription drug management.    Medical Screen Complete  This patient presented to the ED with complaint of  hypertension.  This complaint involves an extensive number of treatment options. The initial differential diagnosis includes, but is not limited to, end organ damage from hypertensive crisis,  Metabolic abnormality.  This presentation is: Chronic, Self-Limited, Previously Undiagnosed, Uncertain Prognosis, Complicated, Systemic Symptoms, and Threat to Life/Bodily Function  Patient with known history of hypertension.  Patient is not taking medications for her blood pressure.  Patient sent in by GYN after outpatient visit demonstrated significantly elevated blood pressure  Patient is asymptomatic.  Patient reassured by normal screening labs (with the exception of mildly decreased potassium).  Patient with history of hypokalemia in the past.  She does take potassium supplements per her report.  Patient does understand need for close outpatient follow-up with her PCP regarding her uncontrolled hypertension.  Patient given prescriptions for antihypertensives that she had previously taken.  She is encouraged to start taking the Norvasc prior to close follow-up with her PCP on Monday.     Additional history obtained:  External records from outside sources obtained and reviewed including prior ED visits and prior Inpatient records.    Lab Tests:  I ordered and personally interpreted labs.  The pertinent results include: CBC BMP   Cardiac Monitoring:  The patient was maintained on a cardiac monitor.  I personally viewed and interpreted the cardiac monitor which showed an underlying rhythm of: NSR   Medicines ordered:  I ordered medication including potassium for hypokalemia Reevaluation of the patient after these medicines showed that the patient: improved   Problem List / ED Course:  Hypertension, uncontrolled   Reevaluation:  After the interventions noted above, I reevaluated the patient and found that they have: improved Disposition:  After consideration of the  diagnostic results and the patients response to treatment, I feel that the patent would benefit from close outpatient follow-up.          Final Clinical Impression(s) / ED Diagnoses Final diagnoses:  Hypertension, unspecified type    Rx / DC Orders ED Discharge Orders     None         Valarie Merino, MD 08/07/21 1931

## 2021-08-07 NOTE — Progress Notes (Signed)
      Subjective: Alyssa [name] is a 46 y.o. female who complains of increased sweating at work this week and desires check for vaginal infection and urinary tract infection. Complains of urinary frequency.  Denies vaginal complaints.  Patient states she stopped her BP medication 12/2020. Denies any headache, vision changes or abdominal pain.   Review of Systems  All other systems reviewed and are negative.      08/07/2021   12:08 PM 08/07/2021   12:01 PM 08/07/2021   12:00 PM  Vitals with BMI  Systolic [phone]  Diastolic [phone]    Objective:  Physical Exam Constitutional:      Appearance: Normal appearance.  HENT:     Head: Normocephalic and atraumatic.  Eyes:     Pupils: Pupils are equal, round, and reactive to light.  Cardiovascular:     Rate and Rhythm: Regular rhythm. Tachycardia present.     Heart sounds: Normal heart sounds.  Pulmonary:     Effort: Pulmonary effort is normal.     Breath sounds: Normal breath sounds.  Abdominal:     General: Abdomen is flat. Bowel sounds are normal.     Palpations: Abdomen is soft.  Neurological:     Mental Status: She is alert and oriented to person, place, and time. Mental status is at baseline.  Psychiatric:        Mood and Affect: Mood normal.        Behavior: Behavior normal.        Thought Content: Thought content normal.        Judgment: Judgment normal.     -Vulva: without lesions or discharge -Vagina: discharge present, aptima swab and wet prep obtained -Cervix: absent -Perineum: no lesions -Uterus: absent   Urine dipstick shows positive for RBC's.  Micro exam: negative for WBC's or RBC's.  Microscopic wet-mount exam shows negative for pathogens, normal epithelial cells.   Chaperone offered and declined.  Assessment:/Plan:   1. Urinary frequency  - Urinalysis,Complete w/RFL Culture  2. Sweating abnormality  - WET PREP FOR TRICH, YEAST, CLUE  3. Hypertensive crisis Sent to ED, declines EMS.   Dr Dellis Filbert saw patient and encouraged her to be seen urgently for hypertensive crisis as well     Will contact patient with results of testing completed today. Avoid intercourse until symptoms are resolved. Safe sex encouraged. Avoid the use of soaps or perfumed products in the peri area. Avoid tub baths and sitting in sweaty or wet clothing for prolonged periods of time.

## 2021-12-24 ENCOUNTER — Ambulatory Visit (INDEPENDENT_AMBULATORY_CARE_PROVIDER_SITE_OTHER): Payer: BC Managed Care – PPO | Admitting: Obstetrics & Gynecology

## 2021-12-24 ENCOUNTER — Encounter: Payer: Self-pay | Admitting: Obstetrics & Gynecology

## 2021-12-24 VITALS — BP 122/84 | HR 71 | Ht 62.75 in | Wt 176.0 lb

## 2021-12-24 DIAGNOSIS — Z90710 Acquired absence of both cervix and uterus: Secondary | ICD-10-CM | POA: Diagnosis not present

## 2021-12-24 DIAGNOSIS — Z01419 Encounter for gynecological examination (general) (routine) without abnormal findings: Secondary | ICD-10-CM

## 2021-12-24 NOTE — Progress Notes (Signed)
Alyssa [name] 1975-11-09 [id number]   History:    46 y.o. G1P0A1 Single  RP:  Established patient for Annual Gyn Exam  HPI: XI Robotic TLH/Bilateral Salpingectomy 09/09/2020.  No pelvic pain.  Abstinent currently.  Pap Neg 12/2019 and patho on total hysterectomy/BS including cervix completely benign in 08/2020.  No indication for a Pap at this time.  Breasts normal.  Mammo Neg 07/2021.  BMI 31.43.  Attempting weight loss.  Exercising regularly.  Vit D normal at 46 in 2021.  Fasting health labs here today.  Declines the Flu vaccine.  Past medical history,surgical history, family history and social history were all reviewed and documented in the EPIC chart.  Gynecologic History Patient's last menstrual period was 09/03/2020 (exact date).  Obstetric History OB History  Gravida Para Term Preterm AB Living  1       1 0  SAB IAB Ectopic Multiple Live Births    1          # Outcome Date GA Lbr Len/2nd Weight Sex Delivery Anes PTL Lv  1 IAB              ROS: A ROS was performed and pertinent positives and negatives are included in the history. GENERAL: No fevers or chills. HEENT: No change in vision, no earache, sore throat or sinus congestion. NECK: No pain or stiffness. CARDIOVASCULAR: No chest pain or pressure. No palpitations. PULMONARY: No shortness of breath, cough or wheeze. GASTROINTESTINAL: No abdominal pain, nausea, vomiting or diarrhea, melena or bright red blood per rectum. GENITOURINARY: No urinary frequency, urgency, hesitancy or dysuria. MUSCULOSKELETAL: No joint or muscle pain, no back pain, no recent trauma. DERMATOLOGIC: No rash, no itching, no lesions. ENDOCRINE: No polyuria, polydipsia, no heat or cold intolerance. No recent change in weight. HEMATOLOGICAL: No anemia or easy bruising or bleeding. NEUROLOGIC: No headache, seizures, numbness, tingling or weakness. PSYCHIATRIC: No depression, no loss of interest in normal activity or change in sleep pattern.     Exam:   BP  122/84   Pulse 71   Ht 5' 2.75" (1.594 m)   Wt 176 lb (79.8 kg)   LMP 09/03/2020 (Exact Date) Comment: Urine pregnancy test on 09/09/2020 negative  SpO2 99%   BMI 31.43 kg/m   Body mass index is 31.43 kg/m.  General appearance : Well developed well nourished female. No acute distress HEENT: Eyes: no retinal hemorrhage or exudates,  Neck supple, trachea midline, no carotid bruits, no thyroidmegaly Lungs: Clear to auscultation, no rhonchi or wheezes, or rib retractions  Heart: Regular rate and rhythm, no murmurs or gallops Breast:Examined in sitting and supine position were symmetrical in appearance, no palpable masses or tenderness,  no skin retraction, no nipple inversion, no nipple discharge, no skin discoloration, no axillary or supraclavicular lymphadenopathy Abdomen: no palpable masses or tenderness, no rebound or guarding Extremities: no edema or skin discoloration or tenderness  Pelvic: Vulva: Normal             Vagina: No gross lesions or discharge  Cervix/Uterus absent  Adnexa  Without masses or tenderness  Anus: Normal   Assessment/Plan:  46 y.o. female for annual exam   1. Well female exam with routine gynecological exam XI Robotic TLH/Bilateral Salpingectomy 09/09/2020.  No pelvic pain.  Abstinent currently.  Pap Neg 12/2019 and patho on total hysterectomy/BS including cervix completely benign in 08/2020.  No indication for a Pap at this time.  Breasts normal.  Mammo Neg 07/2021.  BMI 31.43.  Attempting  weight loss.  Exercising regularly.  Vit D normal at 46 in 2021.  Fasting health labs here today.  Declines the Flu vaccine. - CBC - Comp Met (CMET) - TSH - Lipid Profile  2. S/P total hysterectomy   [name] MD, 10:03 AM

## 2021-12-25 LAB — CBC
HCT: 40.5 % (ref 35.0–45.0)
Hemoglobin: 13.7 g/dL (ref 11.7–15.5)
MCH: 29.7 pg (ref 27.0–33.0)
MCHC: 33.8 g/dL (ref 32.0–36.0)
MCV: 87.7 fL (ref 80.0–100.0)
MPV: 11.2 fL (ref 7.5–12.5)
Platelets: 334 10*3/uL (ref 140–400)
RBC: 4.62 10*6/uL (ref 3.80–5.10)
RDW: 12.9 % (ref 11.0–15.0)
WBC: 5 10*3/uL (ref 3.8–10.8)

## 2021-12-25 LAB — COMPREHENSIVE METABOLIC PANEL
AG Ratio: 1.3 (calc) (ref 1.0–2.5)
ALT: 5 U/L — ABNORMAL LOW (ref 6–29)
AST: 13 U/L (ref 10–30)
Albumin: 4.1 g/dL (ref 3.6–5.1)
Alkaline phosphatase (APISO): 45 U/L (ref 31–125)
BUN: 11 mg/dL (ref 7–25)
CO2: 25 mmol/L (ref 20–32)
Calcium: 9.2 mg/dL (ref 8.6–10.2)
Chloride: 104 mmol/L (ref 98–110)
Creat: 0.81 mg/dL (ref 0.50–0.99)
Globulin: 3.2 g/dL (calc) (ref 1.9–3.7)
Glucose, Bld: 82 mg/dL (ref 65–99)
Potassium: 3.8 mmol/L (ref 3.5–5.3)
Sodium: 137 mmol/L (ref 135–146)
Total Bilirubin: 0.4 mg/dL (ref 0.2–1.2)
Total Protein: 7.3 g/dL (ref 6.1–8.1)

## 2021-12-25 LAB — LIPID PANEL
Cholesterol: 202 mg/dL — ABNORMAL HIGH (ref <200)
HDL: 41 mg/dL — ABNORMAL LOW (ref >=50)
LDL Cholesterol (Calc): 139 mg/dL (calc) — ABNORMAL HIGH
Non-HDL Cholesterol (Calc): 161 mg/dL (calc) — ABNORMAL HIGH (ref <130)
Total CHOL/HDL Ratio: 4.9 (calc) (ref <5.0)
Triglycerides: 103 mg/dL (ref <150)

## 2021-12-25 LAB — TSH: TSH: 1.99 mIU/L

## 2022-03-28 ENCOUNTER — Other Ambulatory Visit: Payer: Self-pay | Admitting: Obstetrics & Gynecology

## 2022-03-28 DIAGNOSIS — B009 Herpesviral infection, unspecified: Secondary | ICD-10-CM

## 2022-03-29 ENCOUNTER — Encounter: Payer: Self-pay | Admitting: Obstetrics & Gynecology

## 2022-03-29 ENCOUNTER — Ambulatory Visit: Payer: BC Managed Care – PPO | Admitting: Obstetrics & Gynecology

## 2022-03-29 VITALS — BP 126/82 | HR 66 | Temp 98.2°F | Wt 177.0 lb

## 2022-03-29 DIAGNOSIS — N760 Acute vaginitis: Secondary | ICD-10-CM

## 2022-03-29 DIAGNOSIS — B009 Herpesviral infection, unspecified: Secondary | ICD-10-CM

## 2022-03-29 DIAGNOSIS — N898 Other specified noninflammatory disorders of vagina: Secondary | ICD-10-CM

## 2022-03-29 DIAGNOSIS — R3989 Other symptoms and signs involving the genitourinary system: Secondary | ICD-10-CM | POA: Diagnosis not present

## 2022-03-29 LAB — WET PREP FOR TRICH, YEAST, CLUE

## 2022-03-29 MED ORDER — VALACYCLOVIR HCL 500 MG PO TABS: ORAL_TABLET | ORAL | 4 refills | Status: DC

## 2022-03-29 MED ORDER — FLUCONAZOLE 150 MG PO TABS: 150.0000 mg | ORAL_TABLET | ORAL | 0 refills | Status: AC

## 2022-03-29 MED ORDER — TINIDAZOLE 500 MG PO TABS: 1000.0000 mg | ORAL_TABLET | Freq: Two times a day (BID) | ORAL | 0 refills | Status: AC

## 2022-03-29 NOTE — Telephone Encounter (Signed)
AEX 12/24/21

## 2022-03-29 NOTE — Progress Notes (Signed)
    Alyssa [name] 07-14-1975 [id number]        47 y.o.  [name]   RP: Pelvic pressure when sitting  HPI: Intermittent pelvic pressure when sitting.  H/O genital herpes, no lesion felt or seen, but would like to restart on Valacyclovir prophylaxis.  No UTI Sx otherwise.  No constipation.  No fever.  Using condoms strictly with IC.   OB History  Gravida Para Term Preterm AB Living  1       1 0  SAB IAB Ectopic Multiple Live Births    1          # Outcome Date GA Lbr Len/2nd Weight Sex Delivery Anes PTL Lv  1 IAB             Past medical history,surgical history, problem list, medications, allergies, family history and social history were all reviewed and documented in the EPIC chart.   Directed ROS with pertinent positives and negatives documented in the history of present illness/assessment and plan.  Exam:  Vitals:   03/29/22 1337  BP: 126/82  Pulse: 66  Temp: 98.2 F (36.8 C)  SpO2: 100%  Weight: 177 lb (80.3 kg)   General appearance:  Normal  CVAT Neg bilaterally  Abdomen: Soft, NT, not distended  Gynecologic exam: Vulva normal.  Speculum:  Vagina normal.  Increased discharge.  Wet prep and SureSwab done.  Bimanual exam:  No pelvic mass, NT.  U/A: Dark yellow, cloudy, Pro Trace, Nit Neg, WBC 0-5, RBC 0-2, Bacteria Few. Crystals Neg, Casts Neg.  No yeast.  U. Culture pending.  Wet prep:  Clue cells present.  Odor pos.   Assessment/Plan:  47 y.o. [name]   1. Sensation of pressure in bladder area Intermittent pelvic pressure when sitting.  H/O genital herpes, no lesion felt or seen, but would like to restart on Valacyclovir prophylaxis.  No UTI Sx otherwise.  No constipation.  No fever.  Using condoms strictly with IC.  U/A wnl, will wait on U. Culture. - Urinalysis,Complete w/RFL Culture  2. Vaginal discharge Wet prep showing BV.  Will treat with Tinidazole.  Fluconazole after. - WET PREP FOR TRICH, YEAST, CLUE - SureSwab Advanced Vaginitis Plus,TMA    3. HSV infection - valACYclovir (VALTREX) 500 MG tablet; PROPHYLAXIS 1 TAB PO DAILY.  IF HAS AN OUTBREAK, TAKE 1 TABLET TWICE A DAY  FOR 5 DAYS  Other orders - tinidazole (TINDAMAX) 500 MG tablet; Take 2 tablets (1,000 mg total) by mouth 2 (two) times daily for 2 days. - fluconazole (DIFLUCAN) 150 MG tablet; Take 1 tablet (150 mg total) by mouth every other day for 3 doses.   [name] MD, 1:43 PM 03/29/2022

## 2022-03-31 LAB — URINALYSIS, COMPLETE W/RFL CULTURE
Bilirubin Urine: NEGATIVE
Casts: NONE SEEN /LPF
Crystals: NONE SEEN /HPF
Glucose, UA: NEGATIVE
Hyaline Cast: NONE SEEN /LPF
Ketones, ur: NEGATIVE
Nitrites, Initial: NEGATIVE
Specific Gravity, Urine: 1.015 (ref 1.001–1.035)
Yeast: NONE SEEN /HPF
pH: 6.5 (ref 5.0–8.0)

## 2022-03-31 LAB — URINE CULTURE
MICRO NUMBER:: 14674921
SPECIMEN QUALITY:: ADEQUATE

## 2022-03-31 LAB — SURESWAB® ADVANCED VAGINITIS PLUS,TMA
C. trachomatis RNA, TMA: NOT DETECTED
CANDIDA SPECIES: NOT DETECTED
Candida glabrata: NOT DETECTED
N. gonorrhoeae RNA, TMA: NOT DETECTED
SURESWAB(R) ADV BACTERIAL VAGINOSIS(BV),TMA: POSITIVE — AB
TRICHOMONAS VAGINALIS (TV),TMA: NOT DETECTED

## 2022-03-31 LAB — CULTURE INDICATED

## 2022-04-05 NOTE — Telephone Encounter (Signed)
Patient came in for ov on 03/29/22

## 2022-05-03 ENCOUNTER — Encounter: Payer: Self-pay | Admitting: Nurse Practitioner

## 2022-05-03 ENCOUNTER — Ambulatory Visit: Payer: BC Managed Care – PPO | Admitting: Nurse Practitioner

## 2022-05-03 VITALS — BP 138/78 | HR 79

## 2022-05-03 DIAGNOSIS — N94818 Other vulvodynia: Secondary | ICD-10-CM

## 2022-05-03 LAB — WET PREP FOR TRICH, YEAST, CLUE

## 2022-05-03 NOTE — Progress Notes (Signed)
   Acute Office Visit  Subjective:    Patient ID: Alyssa [name], female    DOB: 1976-01-09, 47 y.o.   MRN: [id number]   HPI 47 y.o. presents today for mild vulvar discomfort that she notices with sitting only. Treated for BV 03/29/22, no symptoms at that time. Denies discharge, itching or odor. Negative STD screening 03/29/22. Going out of the country soon and wants to make sure she does not have another infection.   Patient's last menstrual period was 09/03/2020 (exact date).    Review of Systems  Constitutional: Negative.   Genitourinary:  Positive for vaginal pain (Mild vulvar discomfort). Negative for vaginal discharge.       Objective:    Physical Exam Constitutional:      Appearance: Normal appearance.  Genitourinary:    General: Normal vulva.     Vagina: Normal.     BP 138/78 (BP Location: Right Arm, Patient Position: Sitting)   Pulse 79   LMP 09/03/2020 (Exact Date) Comment: Urine pregnancy test on 09/09/2020 negative  SpO2 99%  Wt Readings from Last 3 Encounters:  03/29/22 177 lb (80.3 kg)  12/24/21 176 lb (79.8 kg)  10/27/20 170 lb (77.1 kg)        Patient informed chaperone available to be present for breast and/or pelvic exam. Patient has requested no chaperone to be present. Patient has been advised what will be completed during breast and pelvic exam.   Wet prep negative for pathogens  Assessment & Plan:   Problem List Items Addressed This Visit   None Visit Diagnoses     Vulvar discomfort    -  Primary   Relevant Orders   WET PREP FOR TRICH, YEAST, CLUE      Plan: Negative wet prep and exam. Reassurance provided. Follow up as needed.      Olivia Mackie DNP, 10:08 AM 05/03/2022

## 2022-06-16 ENCOUNTER — Other Ambulatory Visit: Payer: Self-pay | Admitting: Internal Medicine

## 2022-06-16 DIAGNOSIS — Z1231 Encounter for screening mammogram for malignant neoplasm of breast: Secondary | ICD-10-CM

## 2022-07-01 ENCOUNTER — Ambulatory Visit: Payer: BC Managed Care – PPO | Admitting: Radiology

## 2022-07-01 VITALS — BP 180/110

## 2022-07-01 DIAGNOSIS — N761 Subacute and chronic vaginitis: Secondary | ICD-10-CM

## 2022-07-01 DIAGNOSIS — I10 Essential (primary) hypertension: Secondary | ICD-10-CM | POA: Diagnosis not present

## 2022-07-01 DIAGNOSIS — N949 Unspecified condition associated with female genital organs and menstrual cycle: Secondary | ICD-10-CM

## 2022-07-01 DIAGNOSIS — B3732 Chronic candidiasis of vulva and vagina: Secondary | ICD-10-CM | POA: Diagnosis not present

## 2022-07-01 DIAGNOSIS — R350 Frequency of micturition: Secondary | ICD-10-CM

## 2022-07-01 LAB — URINALYSIS, COMPLETE W/RFL CULTURE
Bacteria, UA: NONE SEEN /HPF
Bilirubin Urine: NEGATIVE
Glucose, UA: NEGATIVE
Hgb urine dipstick: NEGATIVE
Hyaline Cast: NONE SEEN /LPF
Ketones, ur: NEGATIVE
Leukocyte Esterase: NEGATIVE
Nitrites, Initial: NEGATIVE
Protein, ur: NEGATIVE
RBC / HPF: NONE SEEN /HPF (ref 0–2)
Specific Gravity, Urine: 1.01 (ref 1.001–1.035)
WBC, UA: NONE SEEN /HPF (ref 0–5)
pH: 6.5 (ref 5.0–8.0)

## 2022-07-01 LAB — WET PREP FOR TRICH, YEAST, CLUE

## 2022-07-01 LAB — NO CULTURE INDICATED

## 2022-07-01 MED ORDER — FLUCONAZOLE 150 MG PO TABS: 150.0000 mg | ORAL_TABLET | Freq: Once | ORAL | 0 refills | Status: AC

## 2022-07-01 MED ORDER — METRONIDAZOLE 0.75 % VA GEL: 1.0000 | Freq: Every day | VAGINAL | 0 refills | Status: AC

## 2022-07-01 NOTE — Addendum Note (Signed)
Addended by: Arlie Solomons on: 07/01/2022 03:00 PM   Modules accepted: Orders

## 2022-07-01 NOTE — Progress Notes (Addendum)
      Subjective: Alyssa [name] is a 47 y.o. female who complains of vaginal discomfort--noticed yesterday, seems better today, no discharge. Symptoms x's couple of days. Also has noticed some urinary frequency.  Review of Systems  All other systems reviewed and are negative.   Past Medical History:  Diagnosis Date   Anemia due to blood loss, chronic    due to menorrhagia   Eczema    History of 2019 novel coronavirus disease (COVID-19) 02/01/2020   pt tested coivd positive , result in epic, which was done for surgery.  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   s/p cryo   History of herpes genitalis    History of kidney stones    last stone with surgery done 2007   History of sexually transmitted disease 2006   Hypertension    Menorrhagia    Uterine fibroid       Objective:  Today's Vitals   07/01/22 1441 07/01/22 1443  BP: (!) 174/112 (!) 180/110   There is no height or weight on file to calculate BMI.   Physical Exam Vitals and nursing note reviewed. Exam conducted with a chaperone present.  Constitutional:      Appearance: She is normal weight.  Pulmonary:     Effort: Pulmonary effort is normal.  Abdominal:     General: Abdomen is flat.  Genitourinary:    General: Normal vulva.     Labia:        Right: No rash.        Left: No rash.      Vagina: Vaginal discharge present.     Uterus: Absent.   Neurological:     Mental Status: She is alert.  Psychiatric:        Mood and Affect: Mood normal.        Thought Content: Thought content normal.        Judgment: Judgment normal.       Microscopic wet-mount exam shows clue cells.   Raynelle Fanning, CMA present for exam  Assessment:/Plan:  1. Vaginal discomfort +BV would like preventative yeast treatment as well - WET PREP FOR TRICH, YEAST, CLUE - metroNIDAZOLE (METROGEL) 0.75 % vaginal gel; Place 1 Applicatorful vaginally at  bedtime for 5 days.  Dispense: 70 g; Refill: 0 - fluconazole (DIFLUCAN) 150 MG tablet; Take 1 tablet (150 mg total) by mouth once for 1 dose.  Dispense: 1 tablet; Refill: 0  2. Elevated blood pressure reading in office with diagnosis of hypertension F/u with PCP for med restart     Will contact patient with results of testing completed today. Avoid intercourse until symptoms are resolved. Safe sex encouraged. Avoid the use of soaps or perfumed products in the peri area. Avoid tub baths and sitting in sweaty or wet clothing for prolonged periods of time.

## 2022-07-02 NOTE — Telephone Encounter (Signed)
It is likely from the BV, often the vaginal tissue can be sensitive when BV is present. It should improve with every use.

## 2022-07-08 DIAGNOSIS — N898 Other specified noninflammatory disorders of vagina: Secondary | ICD-10-CM

## 2022-07-08 MED ORDER — FLUCONAZOLE 150 MG PO TABS: 150.0000 mg | ORAL_TABLET | Freq: Once | ORAL | 0 refills | Status: AC

## 2022-07-08 NOTE — Telephone Encounter (Signed)
Will route to provider for final review and close.  

## 2022-07-08 NOTE — Telephone Encounter (Signed)
Ok to send another?

## 2022-07-28 NOTE — Telephone Encounter (Signed)
Pt also LVM in triage line this AM re: this question.   Question answered via mychart and read by pt.

## 2022-08-02 ENCOUNTER — Ambulatory Visit: Admission: RE | Admit: 2022-08-02 | Payer: BC Managed Care – PPO | Source: Ambulatory Visit

## 2022-08-02 DIAGNOSIS — Z1231 Encounter for screening mammogram for malignant neoplasm of breast: Secondary | ICD-10-CM

## 2022-08-03 ENCOUNTER — Other Ambulatory Visit: Payer: Self-pay | Admitting: Internal Medicine

## 2022-08-03 DIAGNOSIS — R928 Other abnormal and inconclusive findings on diagnostic imaging of breast: Secondary | ICD-10-CM

## 2022-08-09 ENCOUNTER — Ambulatory Visit: Payer: BC Managed Care – PPO

## 2022-08-09 ENCOUNTER — Ambulatory Visit
Admission: RE | Admit: 2022-08-09 | Discharge: 2022-08-09 | Disposition: A | Discharge status: Home / Self Care | Payer: BC Managed Care – PPO | Source: Ambulatory Visit | Attending: Internal Medicine | Admitting: Internal Medicine

## 2022-08-09 ENCOUNTER — Ambulatory Visit: Admission: RE | Admit: 2022-08-09 | Payer: BC Managed Care – PPO | Source: Ambulatory Visit

## 2022-08-09 DIAGNOSIS — R928 Other abnormal and inconclusive findings on diagnostic imaging of breast: Secondary | ICD-10-CM

## 2022-12-15 ENCOUNTER — Ambulatory Visit: Payer: BC Managed Care – PPO | Admitting: Radiology

## 2022-12-15 ENCOUNTER — Telehealth: Payer: Self-pay | Admitting: *Deleted

## 2022-12-15 ENCOUNTER — Other Ambulatory Visit: Payer: Self-pay | Admitting: Radiology

## 2022-12-15 ENCOUNTER — Encounter: Payer: Self-pay | Admitting: Radiology

## 2022-12-15 VITALS — BP 166/112

## 2022-12-15 DIAGNOSIS — N760 Acute vaginitis: Secondary | ICD-10-CM

## 2022-12-15 DIAGNOSIS — R300 Dysuria: Secondary | ICD-10-CM

## 2022-12-15 LAB — WET PREP FOR TRICH, YEAST, CLUE

## 2022-12-15 MED ORDER — FLUCONAZOLE 150 MG PO TABS: 150.0000 mg | ORAL_TABLET | Freq: Once | ORAL | 0 refills | Status: AC

## 2022-12-15 MED ORDER — METRONIDAZOLE 0.75 % VA GEL
1.0000 | Freq: Every day | VAGINAL | 0 refills | Status: DC
Start: 2022-12-15 — End: 2022-12-15

## 2022-12-15 MED ORDER — METRONIDAZOLE 0.75 % VA GEL
1.0000 | Freq: Every day | VAGINAL | 0 refills | Status: AC
Start: 2022-12-15 — End: 2022-12-20

## 2022-12-15 NOTE — Telephone Encounter (Signed)
Patient notified. Encounter closed

## 2022-12-15 NOTE — Telephone Encounter (Signed)
Patient returned call requesting to discuss UA results dated 12/15/22.   Advised UA completed in office, urine culture pending, advised this takes 72 hours to complete. Patient asking if anything needs to be treated at this time for UA? Advised I will forward message to [facility] for further review. Advised leukocytes & nitrites negative, waiting for urine culture. Patient aware office closed for holiday, will re-open on 12/2, response may not be immediate. Patient verbalizes understanding.   Routing to [facility] for review.

## 2022-12-15 NOTE — Telephone Encounter (Signed)
Rx sent 

## 2022-12-15 NOTE — Progress Notes (Signed)
      Subjective: Alyssa [name] is a 47 y.o. female who complains of dysuria, vaginal irritation, no discharge, no itching, no odor. Symptoms began 2 days ago.  Recently returned from a trip to [place]. Did swim while there.No new sexual partners.   Review of Systems  All other systems reviewed and are negative.   Past Medical History:  Diagnosis Date   Anemia due to blood loss, chronic    due to menorrhagia   Eczema    History of 2019 novel coronavirus disease (COVID-19) 02/01/2020   pt tested coivd positive , result in epic, which was done for surgery.  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   s/p cryo   History of herpes genitalis    History of kidney stones    last stone with surgery done 2007   History of sexually transmitted disease 2006   Hypertension    Menorrhagia    Uterine fibroid       Objective:  Today's Vitals   12/15/22 0849 12/15/22 0850  BP: (!) 170/118 (!) 166/112   There is no height or weight on file to calculate BMI.   Physical Exam Vitals and nursing note reviewed. Exam conducted with a chaperone present.  Constitutional:      Appearance: Normal appearance. She is well-developed.  Pulmonary:     Effort: Pulmonary effort is normal.  Abdominal:     General: Abdomen is flat.     Palpations: Abdomen is soft.  Genitourinary:    General: Normal vulva.     Vagina: Vaginal discharge present. No erythema, bleeding or lesions.     Uterus: Absent.      Adnexa: Right adnexa normal and left adnexa normal.  Neurological:     Mental Status: She is alert.  Psychiatric:        Mood and Affect: Mood normal.        Thought Content: Thought content normal.        Judgment: Judgment normal.    Microscopic wet-mount exam shows clue cells.   Raynelle Fanning, CMA present for exam  Assessment:/Plan:   1. Acute vaginitis +BV - WET PREP FOR TRICH, YEAST, CLUE - metroNIDAZOLE  (METROGEL) 0.75 % vaginal gel; Place 1 Applicatorful vaginally at bedtime for 5 days.  Dispense: 70 g; Refill: 0  2. Dysuria - Urinalysis,Complete w/RFL Culture    Will contact patient with results of testing completed today. Avoid intercourse until symptoms are resolved. Safe sex encouraged. Avoid the use of soaps or perfumed products in the peri area. Avoid tub baths and sitting in sweaty or wet clothing for prolonged periods of time.     Tawni Melkonian B, NP 9:21 AM

## 2022-12-15 NOTE — Telephone Encounter (Signed)
Call returned to patient. Patient requesting diflucan. Patient states anytime she gets antibiotics she request diflucan for yeast. States this was previously prescribed by ML this way. Advised I will provide update to [facility].

## 2022-12-15 NOTE — Patient Instructions (Signed)

## 2022-12-15 NOTE — Telephone Encounter (Signed)
Spoke with patient. Patient reports "weird tingles" in vagina, unable to explain. Reports vaginal discharge that is unusual for her. Denies pain or urinary symptoms. Is scheduled for AEX 12/29/22, asking if separate OV needed? Offered OV today at 0845 to address symptoms. Keep AEX as scheduled. Patient agreeable.   Routing to provider for final review. Patient is agreeable to disposition. Will close encounter.

## 2022-12-15 NOTE — Telephone Encounter (Signed)
Patient left message stating she was seen this morning, requesting Rx for diflucan to take with Metrogel.   Call returned to patient for more information, no answer. Left message to call [facility] Triage at [phone], option 4.

## 2022-12-15 NOTE — Telephone Encounter (Addendum)
See telephone encounter dated 12/15/22.   Encounter closed.

## 2022-12-17 LAB — URINE CULTURE
MICRO NUMBER:: 15787535
Result:: NO GROWTH
SPECIMEN QUALITY:: ADEQUATE

## 2022-12-17 LAB — URINALYSIS, COMPLETE W/RFL CULTURE
Bilirubin Urine: NEGATIVE
Glucose, UA: NEGATIVE
Hgb urine dipstick: NEGATIVE
Hyaline Cast: NONE SEEN /[LPF]
Ketones, ur: NEGATIVE
Leukocyte Esterase: NEGATIVE
Nitrites, Initial: NEGATIVE
RBC / HPF: NONE SEEN /[HPF] (ref 0–2)
Specific Gravity, Urine: 1.02 (ref 1.001–1.035)
pH: 7 (ref 5.0–8.0)

## 2022-12-17 LAB — CULTURE INDICATED

## 2022-12-26 ENCOUNTER — Encounter (HOSPITAL_COMMUNITY): Payer: Self-pay | Admitting: *Deleted

## 2022-12-26 ENCOUNTER — Ambulatory Visit (INDEPENDENT_AMBULATORY_CARE_PROVIDER_SITE_OTHER): Payer: BC Managed Care – PPO

## 2022-12-26 ENCOUNTER — Ambulatory Visit (HOSPITAL_COMMUNITY)
Admission: EM | Admit: 2022-12-26 | Discharge: 2022-12-26 | Disposition: A | Discharge status: Home / Self Care | Payer: BC Managed Care – PPO | Attending: Internal Medicine | Admitting: Internal Medicine

## 2022-12-26 ENCOUNTER — Other Ambulatory Visit: Payer: Self-pay

## 2022-12-26 DIAGNOSIS — S42254A Nondisplaced fracture of greater tuberosity of right humerus, initial encounter for closed fracture: Secondary | ICD-10-CM

## 2022-12-26 NOTE — ED Provider Notes (Signed)
[facility]    CSN: [id number] Arrival date & time: 12/26/22  1650      History   Chief Complaint Chief Complaint  Patient presents with   Shoulder Injury    HPI Alyssa [name] is a 47 y.o. female.   47 year old female who presents to urgent care with complaints of right shoulder pain.  She reports that she fell last night after tripping over a box.  She landed on an outstretched arm and immediately started having shoulder pain.  She also reported that she was unable to lift her arm up above a few inches without using her other arm.  She reports when she was trying to take a shower she was unable to get her arm up to wash her hair.  She denies any numbness or tingling into the hand.  She denies any decrease strength in the hand.  She is right-handed.  She denies any history of injury to the shoulder.   Shoulder Injury Pertinent negatives include no chest pain, no abdominal pain and no shortness of breath.    Past Medical History:  Diagnosis Date   Anemia due to blood loss, chronic    due to menorrhagia   Eczema    History of 2019 novel coronavirus disease (COVID-19) 02/01/2020   pt tested coivd positive , result in epic, which was done for surgery.  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   s/p cryo   History of herpes genitalis    History of kidney stones    last stone with surgery done 2007   History of sexually transmitted disease 2006   Hypertension    Menorrhagia    Uterine fibroid     Patient Active Problem List   Diagnosis Date Noted   Postoperative state 09/09/2020   Fibroid    HSV infection    STD (sexually transmitted disease) 08/18/2004   CIN I (cervical intraepithelial neoplasia I) 02/19/2003    Past Surgical History:  Procedure Laterality Date   CYSTOSCOPY WITH RETROGRADE PYELOGRAM, URETEROSCOPY AND STENT PLACEMENT  03-22-2005  @[facility]    DILATATION AND  CURETTAGE/HYSTEROSCOPY WITH MINERVA  02/15/2020   @[facility]    EXTRACORPOREAL SHOCK WAVE LITHOTRIPSY  03/2005   GYNECOLOGIC CRYOSURGERY  2005   for fibroids   HYSTEROSCOPY WITH RESECTOSCOPE  11-04-2008  @[facility]    MYOMECTOMY   MANDIBLE SURGERY Bilateral    REMOVAL CYST'S BILATERAL MANDIBLE AND FOUR TEETH EXTRACTION'S 01-30-2002  @[facility]    ROBOTIC ASSISTED TOTAL HYSTERECTOMY N/A 09/09/2020   Procedure: XI ROBOTIC ASSISTED TOTAL HYSTERECTOMY WITH BILATERAL SALPINGECTOMY;  Surgeon: Genia Del, MD;  Location: [facility] [place];  Service: Gynecology;  Laterality: N/A;   UMBILICAL HERNIA REPAIR     10-01-1999  @[facility] , 2 hernia repair umbilical as child    OB History     Gravida  1   Para      Term      Preterm      AB  1   Living  0      SAB      IAB  1   Ectopic      Multiple      Live Births               Home Medications    Prior to Admission medications   Medication Sig Start Date End Date Taking? Authorizing Provider  amLODipine (NORVASC) 10  MG tablet Take 10 mg by mouth daily.    [provider]  valACYclovir (VALTREX) 500 MG tablet PROPHYLAXIS 1 TAB PO DAILY.  IF HAS AN OUTBREAK, TAKE 1 TABLET TWICE A DAY  FOR 5 DAYS 03/29/22   Genia Del, MD    Family History Family History  Problem Relation Age of Onset   Diabetes Mother    Hypertension Mother    Kidney failure Mother    Breast cancer Maternal Aunt        Age 33's   Diabetes Maternal Grandmother    Hypertension Maternal Grandmother     Social History Social History   Tobacco Use   Smoking status: Never   Smokeless tobacco: Never  Vaping Use   Vaping status: Never Used  Substance Use Topics   Alcohol use: Not Currently    Comment: occ wine   Drug use: Never     Allergies   Patient has no known allergies.   Review of Systems Review of Systems  Constitutional:  Negative for chills and fever.  HENT:  Negative for ear pain and sore throat.   Eyes:  Negative for  pain and visual disturbance.  Respiratory:  Negative for cough and shortness of breath.   Cardiovascular:  Negative for chest pain and palpitations.  Gastrointestinal:  Negative for abdominal pain and vomiting.  Genitourinary:  Negative for dysuria and hematuria.  Musculoskeletal:  Negative for arthralgias and back pain.       Right shoulder pain  Skin:  Negative for color change and rash.  Neurological:  Negative for seizures and syncope.  All other systems reviewed and are negative.    Physical Exam Triage Vital Signs ED Triage Vitals  Encounter Vitals Group     BP 12/26/22 1704 (!) 171/117     Systolic BP Percentile --      Diastolic BP Percentile --      Pulse Rate 12/26/22 1704 76     Resp 12/26/22 1704 18     Temp 12/26/22 1704 98.7 F (37.1 C)     Temp src --      SpO2 12/26/22 1704 99 %     Weight --      Height --      Head Circumference --      Peak Flow --      Pain Score 12/26/22 1703 8     Pain Loc --      Pain Education --      Exclude from Growth Chart --    No data found.  Updated Vital Signs BP (!) 171/117   Pulse 76   Temp 98.7 F (37.1 C)   Resp 18   LMP 09/03/2020 (Exact Date) Comment: Urine pregnancy test on 09/09/2020 negative  SpO2 99%   Visual Acuity Right Eye Distance:   Left Eye Distance:   Bilateral Distance:    Right Eye Near:   Left Eye Near:    Bilateral Near:     Physical Exam Vitals and nursing note reviewed.  Constitutional:      General: She is not in acute distress.    Appearance: She is well-developed.  HENT:     Head: Normocephalic and atraumatic.  Eyes:     Conjunctiva/sclera: Conjunctivae normal.  Cardiovascular:     Rate and Rhythm: Normal rate and regular rhythm.     Heart sounds: No murmur heard. Pulmonary:     Effort: Pulmonary effort is normal. No respiratory distress.  Breath sounds: Normal breath sounds.  Abdominal:     Palpations: Abdomen is soft.     Tenderness: There is no abdominal tenderness.   Musculoskeletal:        General: No swelling.     Right shoulder: Tenderness present. No swelling, deformity or crepitus. Decreased range of motion. Normal strength. Normal pulse.     Cervical back: Neck supple.  Skin:    General: Skin is warm and dry.     Capillary Refill: Capillary refill takes less than 2 seconds.  Neurological:     Mental Status: She is alert.  Psychiatric:        Mood and Affect: Mood normal.      UC Treatments / Results  Labs (all labs ordered are listed, but only abnormal results are displayed) Labs Reviewed - No data to display  EKG   Radiology No results found.  Procedures Procedures (including critical care time)  Medications Ordered in UC Medications - No data to display  Initial Impression / Assessment and Plan / UC Course  I have reviewed the triage vital signs and the nursing notes.  Pertinent labs & imaging results that were available during my care of the patient were reviewed by me and considered in my medical decision making (see chart for details).     Closed nondisplaced fracture of greater tuberosity of right humerus, initial encounter   Unfortunately the x-ray does show a nondisplaced fracture of the greater tuberosity of the right humerus.  Discussed this with the patient and that this will need to follow-up with an orthopedist for further evaluation.  Will have her contact Ortho on Monday to schedule an appointment.  For now she will need to leave her arm in a sling and avoid any heavy activity with this arm.  She will need clearance from orthopedics for further activity.  In regards to her cruise that is scheduled for Friday, if she is able to do the cruise without any heavy activity that would require use of her arm then it is reasonable to go on the screws unless otherwise directed by orthopedics.  Offered the pain medication however she would prefer to use Tylenol and ibuprofen which is reasonable.  Follow-up as needed with  urgent care  Final Clinical Impressions(s) / UC Diagnoses   Final diagnoses:  None   Discharge Instructions   None    ED Prescriptions   None    PDMP not reviewed this encounter.   Landis Martins, [place] 12/26/22 1826

## 2022-12-26 NOTE — ED Triage Notes (Signed)
Pt tripped over a box when she was going into the house last night. Pt reports it is an effort to lift Rt arm.

## 2022-12-26 NOTE — Discharge Instructions (Addendum)
X-ray done today shows a nondisplaced fracture of the greater tuberosity of the humerus which is the long bone in the arm.  This will require follow-up with orthopedics.  You will need to call the orthopedist on Monday to schedule an appointment.  Keep arm in a sling and do not lift anything with this arm until you follow-up with orthopedics.  Take ibuprofen or tylenol for pain. Can use ice on the shoulder to help with pain.

## 2022-12-27 DIAGNOSIS — S42254A Nondisplaced fracture of greater tuberosity of right humerus, initial encounter for closed fracture: Secondary | ICD-10-CM | POA: Insufficient documentation

## 2022-12-29 ENCOUNTER — Ambulatory Visit (INDEPENDENT_AMBULATORY_CARE_PROVIDER_SITE_OTHER): Payer: BC Managed Care – PPO | Admitting: Radiology

## 2022-12-29 ENCOUNTER — Encounter: Payer: Self-pay | Admitting: Radiology

## 2022-12-29 ENCOUNTER — Other Ambulatory Visit (HOSPITAL_COMMUNITY)
Admission: RE | Admit: 2022-12-29 | Discharge: 2022-12-29 | Disposition: A | Discharge status: Home / Self Care | Payer: BC Managed Care – PPO | Source: Ambulatory Visit | Attending: Radiology | Admitting: Radiology

## 2022-12-29 VITALS — BP 164/114 | HR 99 | Ht 63.5 in | Wt 175.0 lb

## 2022-12-29 DIAGNOSIS — B009 Herpesviral infection, unspecified: Secondary | ICD-10-CM

## 2022-12-29 DIAGNOSIS — I10 Essential (primary) hypertension: Secondary | ICD-10-CM

## 2022-12-29 DIAGNOSIS — Z01419 Encounter for gynecological examination (general) (routine) without abnormal findings: Secondary | ICD-10-CM | POA: Diagnosis present

## 2022-12-29 MED ORDER — VALACYCLOVIR HCL 500 MG PO TABS
ORAL_TABLET | ORAL | 4 refills | Status: DC
Start: 2022-12-29 — End: 2023-05-04

## 2022-12-29 NOTE — Patient Instructions (Signed)
Preventive Care 45-45 Years Old, Female Preventive care refers to lifestyle choices and visits with your health care provider that can promote health and wellness. Preventive care visits are also called wellness exams. What can I expect for my preventive care visit? Counseling Your health care provider may ask you questions about your: Medical history, including: Past medical problems. Family medical history. Pregnancy history. Current health, including: Menstrual cycle. Method of birth control. Emotional well-being. Home life and relationship well-being. Sexual activity and sexual health. Lifestyle, including: Alcohol, nicotine or tobacco, and drug use. Access to firearms. Diet, exercise, and sleep habits. Work and work environment. Sunscreen use. Safety issues such as seatbelt and bike helmet use. Physical exam Your health care provider will check your: Height and weight. These may be used to calculate your BMI (body mass index). BMI is a measurement that tells if you are at a healthy weight. Waist circumference. This measures the distance around your waistline. This measurement also tells if you are at a healthy weight and may help predict your risk of certain diseases, such as type 2 diabetes and high blood pressure. Heart rate and blood pressure. Body temperature. Skin for abnormal spots. What immunizations do I need?  Vaccines are usually given at various ages, according to a schedule. Your health care provider will recommend vaccines for you based on your age, medical history, and lifestyle or other factors, such as travel or where you work. What tests do I need? Screening Your health care provider may recommend screening tests for certain conditions. This may include: Lipid and cholesterol levels. Diabetes screening. This is done by checking your blood sugar (glucose) after you have not eaten for a while (fasting). Pelvic exam and Pap test. Hepatitis B test. Hepatitis C  test. HIV (human immunodeficiency virus) test. STI (sexually transmitted infection) testing, if you are at risk. Lung cancer screening. Colorectal cancer screening. Mammogram. Talk with your health care provider about when you should start having regular mammograms. This may depend on whether you have a family history of breast cancer. BRCA-related cancer screening. This may be done if you have a family history of breast, ovarian, tubal, or peritoneal cancers. Bone density scan. This is done to screen for osteoporosis. Talk with your health care provider about your test results, treatment options, and if necessary, the need for more tests. Follow these instructions at home: Eating and drinking  Eat a diet that includes fresh fruits and vegetables, whole grains, lean protein, and low-fat dairy products. Take vitamin and mineral supplements as recommended by your health care provider. Do not drink alcohol if: Your health care provider tells you not to drink. You are pregnant, may be pregnant, or are planning to become pregnant. If you drink alcohol: Limit how much you have to 0-1 drink a day. Know how much alcohol is in your drink. In the [place] one drink equals one 12 oz bottle of beer (355 mL), one 5 oz glass of wine (148 mL), or one 1 oz glass of hard liquor (44 mL). Lifestyle Brush your teeth every morning and night with fluoride toothpaste. Floss one time each day. Exercise for at least 30 minutes 5 or more days each week. Do not use any products that contain nicotine or tobacco. These products include cigarettes, chewing tobacco, and vaping devices, such as e-cigarettes. If you need help quitting, ask your health care provider. Do not use drugs. If you are sexually active, practice safe sex. Use a condom or other form of protection to   prevent STIs. If you do not wish to become pregnant, use a form of birth control. If you plan to become pregnant, see your health care provider for a  prepregnancy visit. Take aspirin only as told by your health care provider. Make sure that you understand how much to take and what form to take. Work with your health care provider to find out whether it is safe and beneficial for you to take aspirin daily. Find healthy ways to manage stress, such as: Meditation, yoga, or listening to music. Journaling. Talking to a trusted person. Spending time with friends and family. Minimize exposure to UV radiation to reduce your risk of skin cancer. Safety Always wear your seat belt while driving or riding in a vehicle. Do not drive: If you have been drinking alcohol. Do not ride with someone who has been drinking. When you are tired or distracted. While texting. If you have been using any mind-altering substances or drugs. Wear a helmet and other protective equipment during sports activities. If you have firearms in your house, make sure you follow all gun safety procedures. Seek help if you have been physically or sexually abused. What's next? Visit your health care provider once a year for an annual wellness visit. Ask your health care provider how often you should have your eyes and teeth checked. Stay up to date on all vaccines. This information is not intended to replace advice given to you by your health care provider. Make sure you discuss any questions you have with your health care provider. Document Revised: 07/02/2020 Document Reviewed: 07/02/2020 Elsevier Patient Education  2024 Elsevier Inc.  

## 2022-12-29 NOTE — Progress Notes (Signed)
   Alyssa [name] 04-Jul-1975 [id number]   History:  47 y.o. G1P0 presents for annual exam.Fell Sunday and fractured right shoulder. Desires refill on valacyclovir (mail order for 1 year). Vaginal infection resolved from visit 2 weeks ago.  Gynecologic History Hysterectomy: 2022  Sexually active: yes  Health Maintenance Last Pap: 2017. Results were: normal, HPV neg. Hx of CIN 1 Last mammogram: 07/2022. Results were: normal   Past medical history, past surgical history, family history and social history were all reviewed and documented in the EPIC chart.  ROS:  A ROS was performed and pertinent positives and negatives are included.  Exam:  Vitals:   12/29/22 1007 12/29/22 1012  BP: (!) 180/120 (!) 164/114  Pulse: 99   SpO2: 99%   Weight: 175 lb (79.4 kg)   Height: 5' 3.5" (1.613 m)    Body mass index is 30.51 kg/m.  General appearance:  Normal Thyroid:  Symmetrical, normal in size, without palpable masses or nodularity. Respiratory  Auscultation:  Clear without wheezing or rhonchi Cardiovascular  Auscultation:  Regular rate, without rubs, murmurs or gallops  Edema/varicosities:  Not grossly evident Abdominal  Soft,nontender, without masses, guarding or rebound.  Liver/spleen:  No organomegaly noted  Hernia:  None appreciated  Skin  Inspection:  Grossly normal Breasts: Examined lying and sitting.   Right: Without masses, retractions, nipple discharge or axillary adenopathy.   Left: Without masses, retractions, nipple discharge or axillary adenopathy. Genitourinary   Inguinal/mons:  Normal without inguinal adenopathy  External genitalia:  Normal appearing vulva with no masses, tenderness, or lesions  BUS/Urethra/Skene's glands:  Normal  Vagina:  Normal appearing with normal color and discharge, no lesions.   Cervix:  absent  Uterus:  absent  Adnexa/parametria:     Rt: Normal in size, without masses or tenderness.   Lt: Normal in size, without masses or  tenderness.  Anus and perineum: Normal   Raynelle Fanning, CMA present for exam  Assessment/Plan:   1. Well woman exam with routine gynecological exam - Cytology - PAP( [facility])  2. HSV infection - valACYclovir (VALTREX) 500 MG tablet; PROPHYLAXIS 1 TAB PO DAILY.  IF HAS AN OUTBREAK, TAKE 1 TABLET TWICE A DAY  FOR 5 DAYS  Dispense: 90 tablet; Refill: 4  3. Elevated blood pressure reading in office with diagnosis of hypertension F/u with PCP, currently on amlodipine   Return in 1 year for annual or sooner prn.  Arlie Solomons B WHNP-BC 10:28 AM 12/29/2022

## 2022-12-30 LAB — CYTOLOGY - PAP
Comment: NEGATIVE
Diagnosis: NEGATIVE
High risk HPV: NEGATIVE

## 2023-02-02 NOTE — Telephone Encounter (Signed)
OV please

## 2023-02-03 ENCOUNTER — Ambulatory Visit: Payer: BC Managed Care – PPO | Admitting: Obstetrics and Gynecology

## 2023-02-03 ENCOUNTER — Encounter: Payer: Self-pay | Admitting: Obstetrics and Gynecology

## 2023-02-03 VITALS — BP 162/94 | HR 70 | Wt 174.0 lb

## 2023-02-03 DIAGNOSIS — N760 Acute vaginitis: Secondary | ICD-10-CM

## 2023-02-03 DIAGNOSIS — N898 Other specified noninflammatory disorders of vagina: Secondary | ICD-10-CM

## 2023-02-03 DIAGNOSIS — B9689 Other specified bacterial agents as the cause of diseases classified elsewhere: Secondary | ICD-10-CM

## 2023-02-03 LAB — WET PREP FOR TRICH, YEAST, CLUE

## 2023-02-03 MED ORDER — METRONIDAZOLE 0.75 % VA GEL
VAGINAL | 0 refills | Status: DC
Start: 2023-02-03 — End: 2023-05-04

## 2023-02-03 NOTE — Telephone Encounter (Signed)
Seen by [facility] today

## 2023-02-03 NOTE — Telephone Encounter (Signed)
Pt also LVM in triage line about this.

## 2023-02-03 NOTE — Patient Instructions (Signed)
 Avoid scented soaps, lotions, and menstrual products, perfumes. Also avoid douching and applying any products directly into the vagina.

## 2023-02-03 NOTE — Progress Notes (Signed)
48 y.o. G49P0010 female status post hysterectomy here for vaginal discharge.  Patient's last menstrual period was 09/03/2020 (exact date).    Pt reports vaginal irritation x2 days. Pt states she takes probiotic otc daily.  Notes recurrent BV with travel.  Last diagnosed in November. Has not been sexually active during this time.   OB History  Gravida Para Term Preterm AB Living  1    1 0  SAB IAB Ectopic Multiple Live Births   1       # Outcome Date GA Lbr Len/2nd Weight Sex Type Anes PTL Lv  1 IAB             Past Medical History:  Diagnosis Date   Anemia due to blood loss, chronic    due to menorrhagia   Eczema    History of 2019 novel coronavirus disease (COVID-19) 02/01/2020   pt tested coivd positive , result in epic, which was done for surgery.  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   s/p cryo   History of herpes genitalis    History of kidney stones    last stone with surgery done 2007   History of sexually transmitted disease 2006   Hypertension    Menorrhagia    Uterine fibroid     Past Surgical History:  Procedure Laterality Date   CYSTOSCOPY WITH RETROGRADE PYELOGRAM, URETEROSCOPY AND STENT PLACEMENT  03-22-2005  @[facility]    DILATATION AND CURETTAGE/HYSTEROSCOPY WITH MINERVA  02/15/2020   @[facility]    EXTRACORPOREAL SHOCK WAVE LITHOTRIPSY  03/2005   GYNECOLOGIC CRYOSURGERY  2005   for fibroids   HYSTEROSCOPY WITH RESECTOSCOPE  11-04-2008  @[facility]    MYOMECTOMY   MANDIBLE SURGERY Bilateral    REMOVAL CYST'S BILATERAL MANDIBLE AND FOUR TEETH EXTRACTION'S 01-30-2002  @[facility]    ROBOTIC ASSISTED TOTAL HYSTERECTOMY N/A 09/09/2020   Procedure: XI ROBOTIC ASSISTED TOTAL HYSTERECTOMY WITH BILATERAL SALPINGECTOMY;  Surgeon: Genia Del, MD;  Location: [facility];  Service: Gynecology;  Laterality: N/A;   UMBILICAL HERNIA REPAIR     10-01-1999  @[facility] , 2 hernia repair  umbilical as child    Current Outpatient Medications on File Prior to Visit  Medication Sig Dispense Refill   amLODipine (NORVASC) 10 MG tablet Take 10 mg by mouth daily.     Probiotic Product (PROBIOTIC PO) Take by mouth.     valACYclovir (VALTREX) 500 MG tablet PROPHYLAXIS 1 TAB PO DAILY.  IF HAS AN OUTBREAK, TAKE 1 TABLET TWICE A DAY  FOR 5 DAYS 90 tablet 4   No current facility-administered medications on file prior to visit.    No Known Allergies    PE Today's Vitals   02/03/23 1145  BP: (!) 162/94  Pulse: 70  SpO2: 98%  Weight: 174 lb (78.9 kg)   Body mass index is 30.34 kg/m.  Physical Exam Vitals reviewed. Exam conducted with a chaperone present.  Constitutional:      General: She is not in acute distress.    Appearance: Normal appearance.  HENT:     Head: Normocephalic and atraumatic.     Nose: Nose normal.  Eyes:     Extraocular Movements: Extraocular movements intact.     Conjunctiva/sclera: Conjunctivae normal.  Pulmonary:     Effort: Pulmonary effort is normal.  Genitourinary:    General: Normal vulva.     Exam position: Lithotomy position.  Vagina: Vaginal discharge present.     Uterus: Absent.      Adnexa: Right adnexa normal and left adnexa normal.     Comments: Uterus and cervix absent Musculoskeletal:        General: Normal range of motion.     Cervical back: Normal range of motion.  Neurological:     General: No focal deficit present.     Mental Status: She is alert.  Psychiatric:        Mood and Affect: Mood normal.        Behavior: Behavior normal.       Assessment and Plan:        Vaginal discharge -     WET PREP FOR TRICH, YEAST, CLUE  BV (bacterial vaginosis) -     metroNIDAZOLE; Place 1 Applicatorful vaginally at bedtime for 5 days, THEN 1 Applicatorful 2 (two) times a week.  Dispense: 290 g; Refill: 0  Recurrent issue. Fourth BV infection over the past year.  Discussed treatment followed by suppression with patient.  She  is amenable to this. Vaginal hygiene reviewed, including avoiding scented soaps, lotions, and menstrual products, perfumes, and douching and applying any products directly into the vagina.  Return office in 3 months for test of cure or sooner if needed.  Rosalyn Gess, MD

## 2023-02-06 ENCOUNTER — Encounter: Payer: Self-pay | Admitting: Obstetrics and Gynecology

## 2023-02-06 DIAGNOSIS — N898 Other specified noninflammatory disorders of vagina: Secondary | ICD-10-CM

## 2023-02-07 MED ORDER — FLUCONAZOLE 150 MG PO TABS
150.0000 mg | ORAL_TABLET | Freq: Once | ORAL | 2 refills | Status: AC
Start: 2023-02-07 — End: 2023-02-07

## 2023-02-07 NOTE — Telephone Encounter (Signed)
Routing to Dr. Kennith Center to review and advise on Rx for yeast.

## 2023-02-07 NOTE — Telephone Encounter (Signed)
Patient left message on triage line requesting f/u on message.   Call returned to patient. Advised MyChart message received, awaiting response from provider, will f/u once received.   Patient states she always gets a yeast infection when treated for BV. Reports "plastic looking discharge on metrogel applicator", confirmed no plastic, patient states this is the best way she can describe. Denies odor or itching. Advised not uncommon to see increase in discharge with use of Metrigel as body is re-balancing. Patient reports tingling sensation in vagina, denies tingling with urination. Patient states she is using last dose of nightly MetroGel tonight and switching to twice weekly for 3 months, is concerned about yeast during that time. States she discussed the need for diflucan during her OV. Advised I will forward to Dr. Kennith Center to review. Advised she is seeing patients, response may not be immediate. Patient verbalizes understanding and is agreeable.   Routing to Dr. Kennith Center

## 2023-03-01 IMAGING — MG MM DIGITAL SCREENING BILAT W/ TOMO AND CAD
8 series · 8 of 24 positions shown · non-contrast
Comparison: Previous exam(s).

CLINICAL DATA: Screening.

EXAM:
DIGITAL SCREENING BILATERAL MAMMOGRAM WITH TOMOSYNTHESIS AND CAD
TECHNIQUE: Bilateral screening digital craniocaudal and mediolateral oblique
mammograms were obtained. Bilateral screening digital breast
tomosynthesis was performed. The images were evaluated with
computer-aided detection.

[R CC synth-2D]
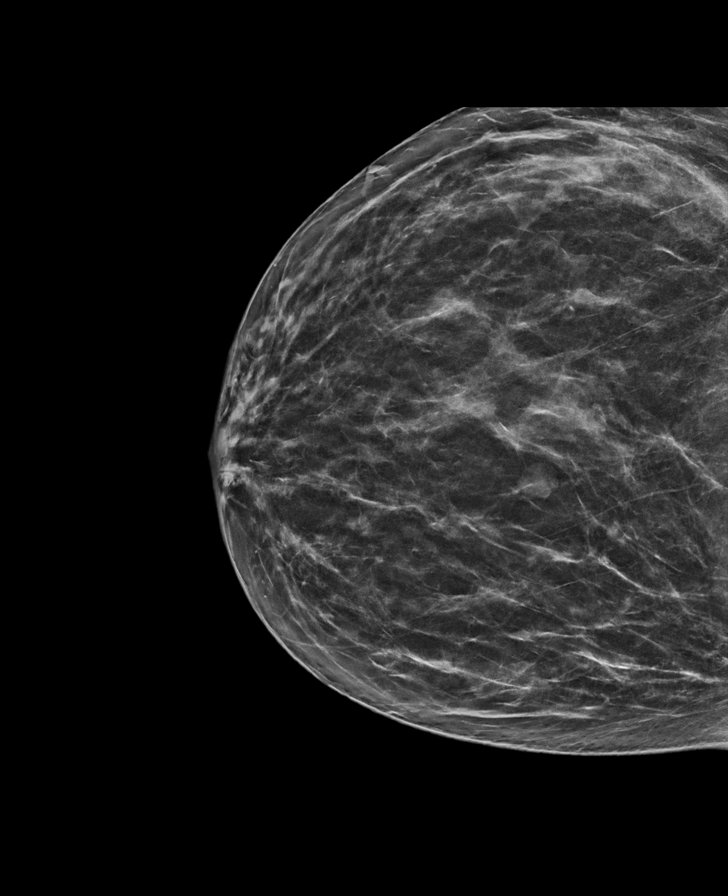

[L CC synth-2D]
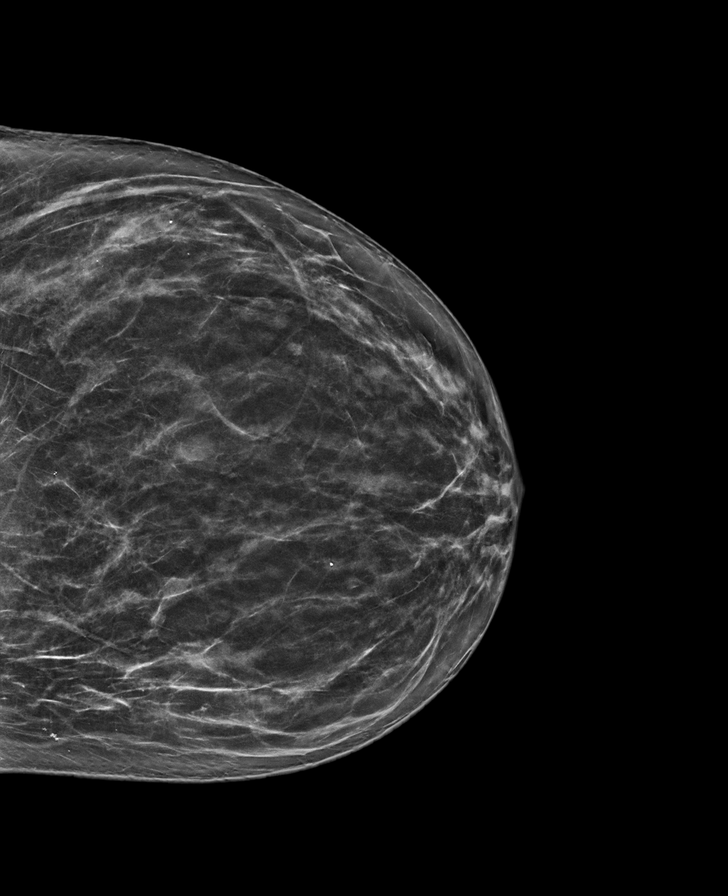

[R MLO synth-2D]
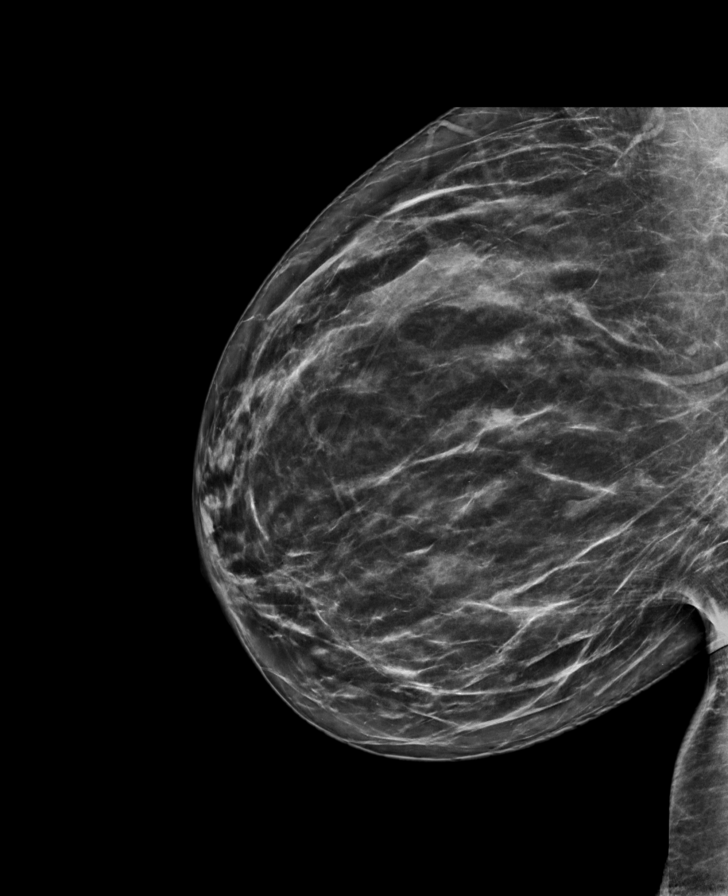

[L MLO synth-2D]
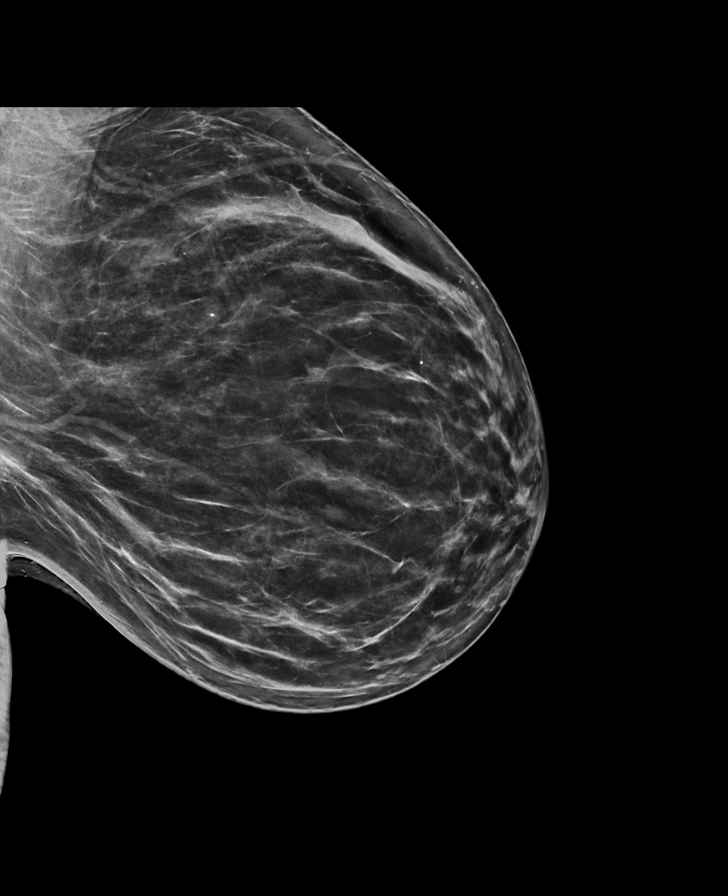

[R CC tomo · tomo slice 37/72.0]
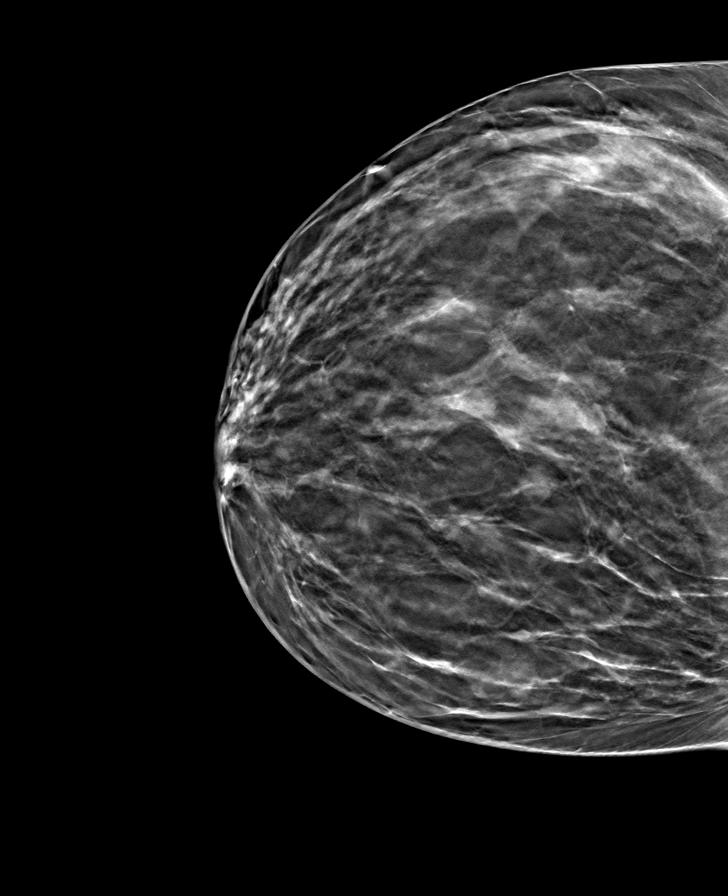

[R MLO tomo · tomo slice 41/82.0]
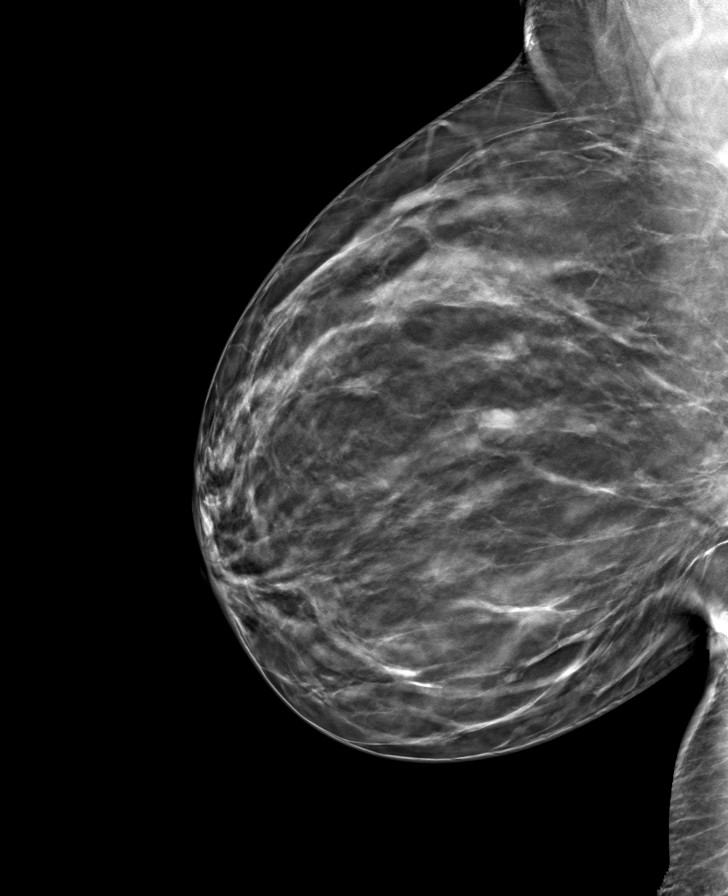

[L CC tomo · tomo slice 36/71.0]
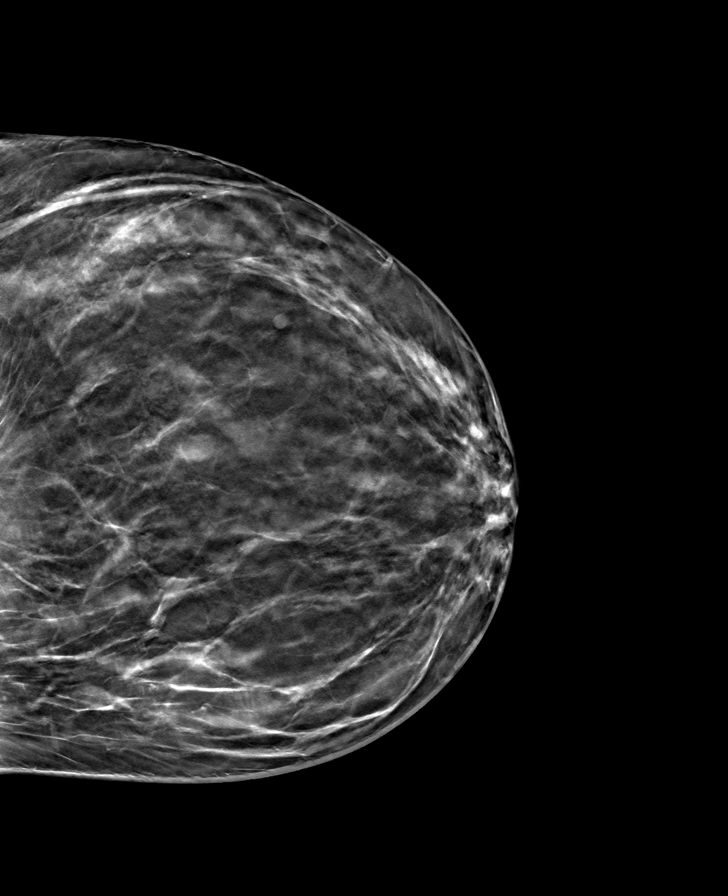

[L MLO tomo · tomo slice 40/79.0]
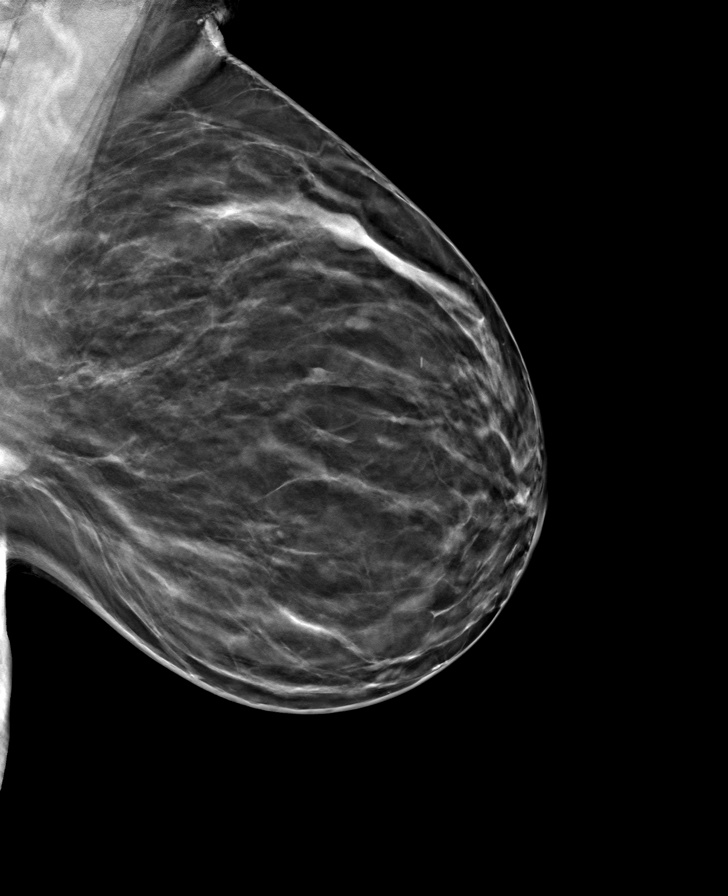

[8 of 24 positions shown; findings below may reference images not displayed]

ACR Breast Density Category b: There are scattered areas of
fibroglandular density.
FINDINGS: There are no findings suspicious for malignancy.
IMPRESSION: No mammographic evidence of malignancy. A result letter of this
screening mammogram will be mailed directly to the patient.

RECOMMENDATION:
Screening mammogram in one year. (Code:51-O-LD2)

BI-RADS CATEGORY  1: Negative.

## 2023-03-08 ENCOUNTER — Encounter: Payer: Self-pay | Admitting: Obstetrics and Gynecology

## 2023-03-09 NOTE — Telephone Encounter (Signed)
 Patient advised of recommendations and will call for appointment if symptoms persist.

## 2023-04-28 ENCOUNTER — Encounter: Payer: Self-pay | Admitting: Obstetrics and Gynecology

## 2023-05-04 ENCOUNTER — Encounter: Payer: Self-pay | Admitting: Obstetrics and Gynecology

## 2023-05-04 ENCOUNTER — Ambulatory Visit: Payer: BC Managed Care – PPO | Admitting: Obstetrics and Gynecology

## 2023-05-04 VITALS — BP 150/80 | HR 83 | Wt 175.0 lb

## 2023-05-04 DIAGNOSIS — N760 Acute vaginitis: Secondary | ICD-10-CM

## 2023-05-04 DIAGNOSIS — B9689 Other specified bacterial agents as the cause of diseases classified elsewhere: Secondary | ICD-10-CM

## 2023-05-04 DIAGNOSIS — B009 Herpesviral infection, unspecified: Secondary | ICD-10-CM

## 2023-05-04 LAB — WET PREP FOR TRICH, YEAST, CLUE

## 2023-05-04 MED ORDER — VALACYCLOVIR HCL 500 MG PO TABS
ORAL_TABLET | ORAL | 4 refills | Status: DC
Start: 2023-05-04 — End: 2023-05-05

## 2023-05-04 NOTE — Progress Notes (Signed)
 48 y.o. G33P0010 female status post hysterectomy with chronic BV, HSV who presents for follow-up visit. Single.  Patient's last menstrual period was 09/03/2020 (exact date).   Treated with MetroGel twice a week for the last 3 months. Has had vaginal irritation thought to be yeast infections over the past 3 months.  Has used Diflucan twice. Still having thick clumpy white discharge and feeling a little irritated today. No vaginal odor and no pain.  Started HSV suppressive medications daily.  Wants to continue.  Request refill.  OB History  Gravida Para Term Preterm AB Living  1    1 0  SAB IAB Ectopic Multiple Live Births   1       # Outcome Date GA Lbr Len/2nd Weight Sex Type Anes PTL Lv  1 IAB             Past Medical History:  Diagnosis Date   Anemia due to blood loss, chronic    due to menorrhagia   Eczema    History of 2019 novel coronavirus disease (COVID-19) 02/01/2020   pt tested coivd positive , result in epic, which was done for surgery.  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   s/p cryo   History of herpes genitalis    History of kidney stones    last stone with surgery done 2007   History of sexually transmitted disease 2006   Hypertension    Menorrhagia    Uterine fibroid     Past Surgical History:  Procedure Laterality Date   CYSTOSCOPY WITH RETROGRADE PYELOGRAM, URETEROSCOPY AND STENT PLACEMENT  03-22-2005  @[facility]    DILATATION AND CURETTAGE/HYSTEROSCOPY WITH MINERVA  02/15/2020   @[facility]    EXTRACORPOREAL SHOCK WAVE LITHOTRIPSY  03/2005   GYNECOLOGIC CRYOSURGERY  2005   for fibroids   HYSTEROSCOPY WITH RESECTOSCOPE  11-04-2008  @[facility]    MYOMECTOMY   MANDIBLE SURGERY Bilateral    REMOVAL CYST'S BILATERAL MANDIBLE AND FOUR TEETH EXTRACTION'S 01-30-2002  @[facility]    ROBOTIC ASSISTED TOTAL HYSTERECTOMY N/A 09/09/2020   Procedure: XI ROBOTIC ASSISTED TOTAL HYSTERECTOMY WITH BILATERAL  SALPINGECTOMY;  Surgeon: Lavoie, Marie-Lyne, MD;  Location: [facility];  Service: Gynecology;  Laterality: N/A;   UMBILICAL HERNIA REPAIR     10-01-1999  @[facility] , 2 hernia repair umbilical as child    Current Outpatient Medications on File Prior to Visit  Medication Sig Dispense Refill   amLODipine (NORVASC) 10 MG tablet Take 10 mg by mouth daily.     No current facility-administered medications on file prior to visit.    No Known Allergies    PE Today's Vitals   05/04/23 1019  BP: (!) 150/80  Pulse: 83  SpO2: 99%  Weight: 175 lb (79.4 kg)   Body mass index is 30.51 kg/m.  Physical Exam Vitals reviewed. Exam conducted with a chaperone present.  Constitutional:      General: She is not in acute distress.    Appearance: Normal appearance.  HENT:     Head: Normocephalic and atraumatic.     Nose: Nose normal.  Eyes:     Extraocular Movements: Extraocular movements intact.     Conjunctiva/sclera: Conjunctivae normal.  Pulmonary:     Effort: Pulmonary effort is normal.  Genitourinary:    General: Normal vulva.     Exam position: Lithotomy position.     Vagina: Vaginal discharge present.  Uterus: Absent.      Adnexa: Right adnexa normal and left adnexa normal.     Comments: Uterus and cervix absent Musculoskeletal:        General: Normal range of motion.     Cervical back: Normal range of motion.  Neurological:     General: No focal deficit present.     Mental Status: She is alert.  Psychiatric:        Mood and Affect: Mood normal.        Behavior: Behavior normal.       Assessment and Plan:        BV (bacterial vaginosis) -     SureSwab Advanced Vaginitis, TMA -     WET PREP FOR TRICH, YEAST, CLUE  HSV infection -     valACYclovir HCl; PROPHYLAXIS 1 TAB PO DAILY.  IF HAS AN OUTBREAK, TAKE 1 TABLET TWICE A DAY  FOR 5 DAYS  Dispense: 90 tablet; Refill: 4   Chronic BV, status post suppressive therapy x 3 months Wet prep negative today  however large amount of discharge present on exam.  Possibly leftover MetroGel. Sure swab sent to confirm negative testing. No treatment needed at this time  Refills for Valtrex provided at patient request  Romaine Closs, MD

## 2023-05-05 ENCOUNTER — Other Ambulatory Visit: Payer: Self-pay | Admitting: Obstetrics and Gynecology

## 2023-05-05 DIAGNOSIS — B009 Herpesviral infection, unspecified: Secondary | ICD-10-CM

## 2023-05-05 LAB — SURESWAB® ADVANCED VAGINITIS,TMA
CANDIDA SPECIES: NOT DETECTED
Candida glabrata: NOT DETECTED
SURESWAB(R) ADV BACTERIAL VAGINOSIS(BV),TMA: NEGATIVE
TRICHOMONAS VAGINALIS (TV),TMA: NOT DETECTED

## 2023-05-05 NOTE — Telephone Encounter (Signed)
 Med refill request: valacyclovir 500 mg Last AEX: 12/29/22 Next AEX: none scheduled Last MMG (if hormonal med) n/a Patient's insurance plan requires a 90 day supply Refill authorized: valacyclovir Please approve or deny as appropriate.

## 2023-05-31 ENCOUNTER — Encounter (HOSPITAL_COMMUNITY): Payer: Self-pay

## 2023-05-31 ENCOUNTER — Ambulatory Visit (HOSPITAL_COMMUNITY)
Admission: EM | Admit: 2023-05-31 | Discharge: 2023-05-31 | Disposition: A | Discharge status: Home / Self Care | Attending: Family Medicine | Admitting: Family Medicine

## 2023-05-31 DIAGNOSIS — R82998 Other abnormal findings in urine: Secondary | ICD-10-CM | POA: Diagnosis not present

## 2023-05-31 DIAGNOSIS — R350 Frequency of micturition: Secondary | ICD-10-CM | POA: Diagnosis not present

## 2023-05-31 DIAGNOSIS — R300 Dysuria: Secondary | ICD-10-CM | POA: Diagnosis not present

## 2023-05-31 DIAGNOSIS — I10 Essential (primary) hypertension: Secondary | ICD-10-CM

## 2023-05-31 LAB — POCT URINALYSIS DIP (MANUAL ENTRY)
Blood, UA: NEGATIVE
Glucose, UA: NEGATIVE mg/dL
Leukocytes, UA: NEGATIVE
Nitrite, UA: NEGATIVE
Protein Ur, POC: 30 mg/dL — AB
Spec Grav, UA: 1.025 (ref 1.010–1.025)
Urobilinogen, UA: 0.2 U/dL
pH, UA: 5.5 (ref 5.0–8.0)

## 2023-05-31 LAB — POCT FASTING CBG KUC MANUAL ENTRY: POCT Glucose (KUC): 82 mg/dL (ref 70–99)

## 2023-05-31 NOTE — ED Triage Notes (Addendum)
 Patient reports that she has had 'slight" dysuria since yesterday.   BP in triage 183/140. Patient states she stopped taking her BP medication 2 years ago.

## 2023-05-31 NOTE — Discharge Instructions (Signed)
 I did not see any signs of infection in your urine.  Your blood pressure was noted to be elevated during your visit today. If you are currently taking medication for high blood pressure, please ensure you are taking this as directed. If you do not have a history of high blood pressure and your blood pressure remains persistently elevated, you may need to begin taking a medication at some point. You may return here within the next few days to recheck if unable to see your primary care provider or if you do not have a one.  BP (!) 183/140 (BP Location: Left Arm) Comment: recheck- 182/137  Pulse 75   Temp 98.1 F (36.7 C) (Oral)   Resp 14   LMP 09/03/2020 (Exact Date) Comment: Urine pregnancy test on 09/09/2020 negative  SpO2 98%   BP Readings from Last 3 Encounters:  05/31/23 (!) 183/140  05/04/23 (!) 150/80  02/03/23 (!) 162/94

## 2023-06-01 ENCOUNTER — Encounter: Payer: Self-pay | Admitting: Obstetrics and Gynecology

## 2023-06-01 NOTE — Telephone Encounter (Signed)
 Routing to Dr. Andrena Ke to review and advise.

## 2023-06-01 NOTE — ED Provider Notes (Signed)
 [facility]    ASSESSMENT & PLAN:  1. Dysuria   2. Elevated blood pressure reading with diagnosis of hypertension   3. Urinary frequency   4. Beeturia    Results for orders placed or performed during the hospital encounter of 05/31/23  POCT urinalysis dipstick   Collection Time: 05/31/23  6:23 PM  Result Value Ref Range   Color, UA red (A) yellow   Clarity, UA clear clear   Glucose, UA negative negative mg/dL   Bilirubin, UA small (A) negative   Ketones, POC UA small (15) (A) negative mg/dL   Spec Grav, UA 5.366 4.403 - 1.025   Blood, UA negative negative   pH, UA 5.5 5.0 - 8.0   Protein Ur, POC =30 (A) negative mg/dL   Urobilinogen, UA 0.2 0.2 or 1.0 E.U./dL   Nitrite, UA Negative Negative   Leukocytes, UA Negative Negative  POC CBG monitoring   Collection Time: 05/31/23  6:44 PM  Result Value Ref Range   POCT Glucose (KUC) 82 70 - 99 mg/dL   She feels color of urine secondary to drinking beet juice today and this usu causes red urine. No signs of infection. Ensure proper hydration.   Follow-up Information     Schedule an appointment as soon as possible for a visit  with Virgle Grime, MD.   Specialty: Internal Medicine Why: To recheck your blood pressure. Contact information: [address] [place] [address] [phone]         [facility].   Specialty: Urgent Care Why: If your urinary symptoms persist. Contact information: [address] [place]  [address] [phone]                 Discharge Instructions      I did not see any signs of infection in your urine.  Your blood pressure was noted to be elevated during your visit today. If you are currently taking medication for high blood pressure, please ensure you are taking this as directed. If you do not have a history of high blood pressure and your blood pressure remains persistently elevated, you may need  to begin taking a medication at some point. You may return here within the next few days to recheck if unable to see your primary care provider or if you do not have a one.  BP (!) 183/140 (BP Location: Left Arm) Comment: recheck- 182/137  Pulse 75   Temp 98.1 F (36.7 C) (Oral)   Resp 14   LMP 09/03/2020 (Exact Date) Comment: Urine pregnancy test on 09/09/2020 negative  SpO2 98%   BP Readings from Last 3 Encounters:  05/31/23 (!) 183/140  05/04/23 (!) 150/80  02/03/23 (!) 162/94      Outlined signs and symptoms indicating need for more acute intervention. Patient verbalized understanding. After Visit Summary given.  SUBJECTIVE:  Alyssa [name] is a 48 y.o. female who complains of slight dysuria and urinary freq; noted yesterday; less today. Beet juice without relief. Denies fever/chills/abd or flank pain. Denies n/v/d. Increased blood pressure noted today. Reports that she is not currently tx for HTN; does have dx of HTN. No symptoms. LMP: Patient's last menstrual period was 09/03/2020 (exact date).  OBJECTIVE:  Vitals:   05/31/23 1809  BP: (!) 183/140  Pulse: 75  Resp: 14  Temp: 98.1 F (36.7 C)  TempSrc: Oral  SpO2: 98%   General appearance: alert; no distress HENT: oropharynx: moist Lungs: unlabored  respirations Abdomen: soft, non-tender Back: no CVA tenderness Extremities: no edema; symmetrical with no gross deformities Skin: warm and dry Neurologic: normal gait Psychological: alert and cooperative; normal mood and affect  Labs Reviewed  POCT URINALYSIS DIP (MANUAL ENTRY) - Abnormal; Notable for the following components:      Result Value   Color, UA red (*)    Bilirubin, UA small (*)    Ketones, POC UA small (15) (*)    Protein Ur, POC =30 (*)    All other components within normal limits  POCT FASTING CBG KUC MANUAL ENTRY - Normal    No Known Allergies  Past Medical History:  Diagnosis Date   Anemia due to blood loss, chronic    due to  menorrhagia   Eczema    History of 2019 novel coronavirus disease (COVID-19) 02/01/2020   pt tested coivd positive , result in epic, which was done for surgery.  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   s/p cryo   History of herpes genitalis    History of kidney stones    last stone with surgery done 2007   History of sexually transmitted disease 2006   Hypertension    Menorrhagia    Uterine fibroid    Social History   Socioeconomic History   Marital status: Single    Spouse name: Not on file   Number of children: Not on file   Years of education: Not on file   Highest education level: Not on file  Occupational History   Not on file  Tobacco Use   Smoking status: Never    Passive exposure: Never   Smokeless tobacco: Never  Vaping Use   Vaping status: Never Used  Substance and Sexual Activity   Alcohol use: Yes    Comment: occ wine   Drug use: Never   Sexual activity: Not Currently    Partners: Male    Birth control/protection: Surgical    Comment: 1st intercourse 48 yo-Fewer than 5 partners, hysterectomy  Other Topics Concern   Not on file  Social History Narrative   Not on file   Social Drivers of Health   Financial Resource Strain: Not on file  Food Insecurity: Not on file  Transportation Needs: Not on file  Physical Activity: Not on file  Stress: Not on file  Social Connections: Not on file  Intimate Partner Violence: Not on file   Family History  Problem Relation Age of Onset   Diabetes Mother    Hypertension Mother    Kidney failure Mother    Diabetes Maternal Grandmother    Hypertension Maternal Grandmother    Breast cancer Maternal Aunt        Age 59's        Afton Albright, MD 06/01/23 1116

## 2023-06-06 ENCOUNTER — Other Ambulatory Visit: Payer: Self-pay | Admitting: Nurse Practitioner

## 2023-06-06 ENCOUNTER — Encounter: Payer: Self-pay | Admitting: Nurse Practitioner

## 2023-06-06 ENCOUNTER — Ambulatory Visit: Admitting: Nurse Practitioner

## 2023-06-06 VITALS — BP 172/110 | HR 82

## 2023-06-06 DIAGNOSIS — R300 Dysuria: Secondary | ICD-10-CM

## 2023-06-06 DIAGNOSIS — B9689 Other specified bacterial agents as the cause of diseases classified elsewhere: Secondary | ICD-10-CM

## 2023-06-06 DIAGNOSIS — N760 Acute vaginitis: Secondary | ICD-10-CM

## 2023-06-06 DIAGNOSIS — Z113 Encounter for screening for infections with a predominantly sexual mode of transmission: Secondary | ICD-10-CM | POA: Diagnosis not present

## 2023-06-06 DIAGNOSIS — N9489 Other specified conditions associated with female genital organs and menstrual cycle: Secondary | ICD-10-CM

## 2023-06-06 LAB — WET PREP FOR TRICH, YEAST, CLUE

## 2023-06-06 MED ORDER — FLUCONAZOLE 150 MG PO TABS: 150.0000 mg | ORAL_TABLET | ORAL | 0 refills | Status: DC

## 2023-06-06 MED ORDER — NUVESSA 1.3 % VA GEL
1.0000 | Freq: Once | VAGINAL | 0 refills | Status: AC
Start: 2023-06-06 — End: 2023-06-06

## 2023-06-06 MED ORDER — CLINDAMYCIN PHOSPHATE 2 % VA CREA
1.0000 | TOPICAL_CREAM | Freq: Every day | VAGINAL | 0 refills | Status: AC
Start: 2023-06-06 — End: 2023-06-13

## 2023-06-06 NOTE — Progress Notes (Signed)
   Acute Office Visit  Subjective:    Patient ID: Alyssa [name], female    DOB: December 03, 1975, 48 y.o.   MRN: [id number]   HPI 48 y.o. presents today for vulvar burning. Went to UC last week for burning with urination - negative UA, told she was dehydrated and to increase water  intake. Went on vacation the next day and spent some time in the pool and in a bathing suit. H/O recurrent BV - did 31-month course of Metrogel , finished this in April. Switched to dove bar soap as well to see if this helps. Denies discharge or odor. Sexually active a couple of weeks ago. Used condom but did fall off.   Patient's last menstrual period was 09/03/2020 (exact date).    Review of Systems  Constitutional: Negative.   Genitourinary:  Positive for dysuria and vaginal pain (Vulvar burning). Negative for vaginal discharge.       Objective:     Physical Exam Constitutional:      Appearance: Normal appearance.  Genitourinary:    General: Normal vulva.     Vagina: Normal.     BP (!) 172/110   Pulse 82   LMP 09/03/2020 (Exact Date) Comment: Urine pregnancy test on 09/09/2020 negative  SpO2 98%  Wt Readings from Last 3 Encounters:  05/04/23 175 lb (79.4 kg)  02/03/23 174 lb (78.9 kg)  12/29/22 175 lb (79.4 kg)        Patient informed chaperone available to be present for breast and/or pelvic exam. Patient has requested no chaperone to be present. Patient has been advised what will be completed during breast and pelvic exam.   Wet prep + clue cells (+ odor)  UA: neg leukocytes, neg nitrites, neg blood, dark yellow/slightly cloudy. Microscopic: wbc 0-5, rbc none, few bacteria  Assessment & Plan:   Problem List Items Addressed This Visit   None Visit Diagnoses       Bacterial vaginosis    -  Primary   Relevant Medications   metroNIDAZOLE  (NUVESSA ) 1.3 % GEL     Dysuria       Relevant Orders   Urinalysis,Complete w/RFL Culture (Completed)     Vulvar burning       Relevant Orders   WET  PREP FOR TRICH, YEAST, CLUE     Screen for STD (sexually transmitted disease)       Relevant Orders   C. trachomatis/N. gonorrhoeae RNA     Recurrent vaginitis       Relevant Orders   Mycoplasma / ureaplasma culture      Plan: Wet prep positive for clue cells - Nuvessa  1.3% vaginal gel once. Ureaplasma/mycoplasma pending, GC/CT pending. Recommend washing with water  only, women's probiotic. Negative UA.   Return if symptoms worsen or fail to improve.    Andee Bamberger DNP, 12:18 PM 06/06/2023

## 2023-06-06 NOTE — Telephone Encounter (Signed)
 Tiffany -please advise on diflucan .   Carolynne Citron -Please assist with PA or formulary exception.

## 2023-06-07 LAB — C. TRACHOMATIS/N. GONORRHOEAE RNA
C. trachomatis RNA, TMA: NOT DETECTED
N. gonorrhoeae RNA, TMA: NOT DETECTED

## 2023-06-08 ENCOUNTER — Ambulatory Visit: Payer: Self-pay | Admitting: Nurse Practitioner

## 2023-06-08 LAB — URINALYSIS, COMPLETE W/RFL CULTURE
Bilirubin Urine: NEGATIVE
Glucose, UA: NEGATIVE
Hgb urine dipstick: NEGATIVE
Hyaline Cast: NONE SEEN /LPF
Ketones, ur: NEGATIVE
Leukocyte Esterase: NEGATIVE
Nitrites, Initial: NEGATIVE
RBC / HPF: NONE SEEN /HPF (ref 0–2)
Specific Gravity, Urine: 1.015 (ref 1.001–1.035)
pH: 6 (ref 5.0–8.0)

## 2023-06-08 LAB — URINE CULTURE
MICRO NUMBER:: 16472237
Result:: NO GROWTH
SPECIMEN QUALITY:: ADEQUATE

## 2023-06-08 LAB — CULTURE INDICATED

## 2023-06-12 LAB — MYCOPLASMA / UREAPLASMA CULTURE

## 2023-06-14 ENCOUNTER — Encounter: Payer: Self-pay | Admitting: Nurse Practitioner

## 2023-06-21 ENCOUNTER — Encounter: Payer: Self-pay | Admitting: Obstetrics and Gynecology

## 2023-06-21 ENCOUNTER — Ambulatory Visit: Admitting: Obstetrics and Gynecology

## 2023-06-21 VITALS — BP 112/70 | HR 79 | Wt 173.0 lb

## 2023-06-21 DIAGNOSIS — N760 Acute vaginitis: Secondary | ICD-10-CM | POA: Diagnosis not present

## 2023-06-21 DIAGNOSIS — B9689 Other specified bacterial agents as the cause of diseases classified elsewhere: Secondary | ICD-10-CM

## 2023-06-21 LAB — WET PREP FOR TRICH, YEAST, CLUE

## 2023-06-21 MED ORDER — METRONIDAZOLE 0.75 % VA GEL
1.0000 | Freq: Every day | VAGINAL | 0 refills | Status: AC
Start: 2023-06-21 — End: 2023-06-26

## 2023-06-21 MED ORDER — METRONIDAZOLE 0.75 % VA GEL
1.0000 | VAGINAL | 0 refills | Status: AC
Start: 2023-06-23 — End: 2023-09-20

## 2023-06-21 NOTE — Progress Notes (Signed)
 48 y.o. G14P0010 female status post hysterectomy with chronic BV, HSV who presents for follow-up visit. Single.  Patient's last menstrual period was 09/03/2020 (exact date).   Treated with MetroGel  twice a week for 3 months at the beginning of this year  Test of cure was -05/04/23. Went to [place] and was sexually active with intermittent partner.  Symptoms returned and she was treated with clindamycin  on 06/06/2023.  Mycoplasma Ureaplasma testing negative. She is still having some irritation today.  She completed her clindamycin  1 week ago.  OB History  Gravida Para Term Preterm AB Living  1    1 0  SAB IAB Ectopic Multiple Live Births   1       # Outcome Date GA Lbr Len/2nd Weight Sex Type Anes PTL Lv  1 IAB             Past Medical History:  Diagnosis Date   Anemia due to blood loss, chronic    due to menorrhagia   Eczema    History of 2019 novel coronavirus disease (COVID-19) 02/01/2020   pt tested coivd positive , result in epic, which was done for surgery.  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   s/p cryo   History of herpes genitalis    History of kidney stones    last stone with surgery done 2007   History of sexually transmitted disease 2006   Hypertension    Menorrhagia    Uterine fibroid     Past Surgical History:  Procedure Laterality Date   CYSTOSCOPY WITH RETROGRADE PYELOGRAM, URETEROSCOPY AND STENT PLACEMENT  03-22-2005  @[facility]    DILATATION AND CURETTAGE/HYSTEROSCOPY WITH MINERVA  02/15/2020   @[facility]    EXTRACORPOREAL SHOCK WAVE LITHOTRIPSY  03/2005   GYNECOLOGIC CRYOSURGERY  2005   for fibroids   HYSTEROSCOPY WITH RESECTOSCOPE  11-04-2008  @[facility]    MYOMECTOMY   MANDIBLE SURGERY Bilateral    REMOVAL CYST'S BILATERAL MANDIBLE AND FOUR TEETH EXTRACTION'S 01-30-2002  @[facility]    ROBOTIC ASSISTED TOTAL HYSTERECTOMY N/A 09/09/2020   Procedure: XI ROBOTIC ASSISTED TOTAL HYSTERECTOMY WITH  BILATERAL SALPINGECTOMY;  Surgeon: Lavoie, Marie-Lyne, MD;  Location: [facility];  Service: Gynecology;  Laterality: N/A;   UMBILICAL HERNIA REPAIR     10-01-1999  @[facility] , 2 hernia repair umbilical as child    Current Outpatient Medications on File Prior to Visit  Medication Sig Dispense Refill   amLODipine  (NORVASC ) 10 MG tablet 1 tablet Orally Once a day for 30 day(s)     metoprolol  succinate (TOPROL -XL) 25 MG 24 hr tablet TAKE 1 TABLET BY MOUTH EVERY DAY FOR 30 DAYS for 90     valACYclovir  (VALTREX ) 500 MG tablet PROPHYLAXIS: TAKE 1 TABLET DAILY, IF HAS AN OUTBREAK, TAKE 1 TABLET TWICE A DAY  FOR 5 DAYS 90 tablet 3   fluconazole  (DIFLUCAN ) 150 MG tablet Take 1 tablet (150 mg total) by mouth every 3 (three) days. (Patient not taking: Reported on 06/21/2023) 2 tablet 0   No current facility-administered medications on file prior to visit.    No Known Allergies    PE Today's Vitals   06/21/23 1421  BP: 112/70  Pulse: 79  SpO2: 99%  Weight: 173 lb (78.5 kg)   Body mass index is 30.16 kg/m.  Physical Exam Vitals reviewed. Exam conducted with a chaperone present.  Constitutional:      General: She is  not in acute distress.    Appearance: Normal appearance.  HENT:     Head: Normocephalic and atraumatic.     Nose: Nose normal.  Eyes:     Extraocular Movements: Extraocular movements intact.     Conjunctiva/sclera: Conjunctivae normal.  Pulmonary:     Effort: Pulmonary effort is normal.  Genitourinary:    General: Normal vulva.     Exam position: Lithotomy position.     Vagina: Vaginal discharge present.     Uterus: Absent.      Adnexa: Right adnexa normal and left adnexa normal.     Comments: Uterus and cervix absent Musculoskeletal:        General: Normal range of motion.     Cervical back: Normal range of motion.  Neurological:     General: No focal deficit present.     Mental Status: She is alert.  Psychiatric:        Mood and Affect: Mood normal.         Behavior: Behavior normal.      Assessment and Plan:        BV (bacterial vaginosis) -     WET PREP FOR TRICH, YEAST, CLUE -     metroNIDAZOLE ; Place 1 Applicatorful vaginally at bedtime for 5 days.  Dispense: 50 g; Refill: 0 -     metroNIDAZOLE ; Place 1 Applicatorful vaginally 2 (two) times a week for 26 doses. 3 months of treatment.  Dispense: 260 g; Refill: 0  Chronic BV, status post suppressive therapy x 3 months with negative testing. Now with recurrence x2. Recommend treatment and suppression x56month again. RTO for TOC in 3 months  Romaine Closs, MD

## 2023-06-22 ENCOUNTER — Ambulatory Visit: Payer: Self-pay | Admitting: Obstetrics and Gynecology

## 2023-06-24 ENCOUNTER — Encounter: Payer: Self-pay | Admitting: Obstetrics and Gynecology

## 2023-06-24 DIAGNOSIS — N760 Acute vaginitis: Secondary | ICD-10-CM

## 2023-06-24 MED ORDER — FLUCONAZOLE 150 MG PO TABS
150.0000 mg | ORAL_TABLET | ORAL | 1 refills | Status: DC
Start: 2023-06-24 — End: 2023-07-11

## 2023-06-24 MED ORDER — METRONIDAZOLE 500 MG PO TABS: 500.0000 mg | ORAL_TABLET | Freq: Two times a day (BID) | ORAL | 0 refills | Status: AC

## 2023-06-28 ENCOUNTER — Encounter: Payer: Self-pay | Admitting: Obstetrics and Gynecology

## 2023-06-28 DIAGNOSIS — N951 Menopausal and female climacteric states: Secondary | ICD-10-CM

## 2023-07-11 ENCOUNTER — Other Ambulatory Visit: Payer: Self-pay | Admitting: Obstetrics and Gynecology

## 2023-07-11 DIAGNOSIS — N760 Acute vaginitis: Secondary | ICD-10-CM

## 2023-07-11 NOTE — Telephone Encounter (Signed)
 Med refill request: Difucan Last AEX: 12/29/22 JC Next AEX: 09/22/23 GH Last MMG (if hormonal med) 08/09/22 Refill authorized: Last Rx sent #2 with one refill on 06/24/23 GH. Please approve or deny.

## 2023-07-12 ENCOUNTER — Encounter: Payer: Self-pay | Admitting: Obstetrics and Gynecology

## 2023-08-05 ENCOUNTER — Other Ambulatory Visit: Payer: Self-pay | Admitting: Obstetrics and Gynecology

## 2023-08-05 DIAGNOSIS — Z1231 Encounter for screening mammogram for malignant neoplasm of breast: Secondary | ICD-10-CM

## 2023-08-24 ENCOUNTER — Ambulatory Visit
Admission: RE | Admit: 2023-08-24 | Discharge: 2023-08-24 | Disposition: A | Discharge status: Home / Self Care | Source: Ambulatory Visit | Attending: Obstetrics and Gynecology

## 2023-08-24 DIAGNOSIS — Z1231 Encounter for screening mammogram for malignant neoplasm of breast: Secondary | ICD-10-CM

## 2023-09-22 ENCOUNTER — Ambulatory Visit: Payer: Self-pay | Admitting: Obstetrics and Gynecology

## 2023-09-22 ENCOUNTER — Encounter: Payer: Self-pay | Admitting: Obstetrics and Gynecology

## 2023-09-22 ENCOUNTER — Ambulatory Visit: Admitting: Obstetrics and Gynecology

## 2023-09-22 VITALS — BP 162/104 | HR 90 | Temp 98.2°F | Wt 177.0 lb

## 2023-09-22 DIAGNOSIS — N760 Acute vaginitis: Secondary | ICD-10-CM

## 2023-09-22 DIAGNOSIS — N951 Menopausal and female climacteric states: Secondary | ICD-10-CM

## 2023-09-22 DIAGNOSIS — B9689 Other specified bacterial agents as the cause of diseases classified elsewhere: Secondary | ICD-10-CM | POA: Diagnosis not present

## 2023-09-22 LAB — WET PREP FOR TRICH, YEAST, CLUE

## 2023-09-22 MED ORDER — METRONIDAZOLE 500 MG PO TABS: 500.0000 mg | ORAL_TABLET | Freq: Two times a day (BID) | ORAL | 0 refills | Status: AC

## 2023-09-22 MED ORDER — BORIC ACID VAGINAL 600 MG VA SUPP: 1.0000 | Freq: Every evening | VAGINAL | 0 refills | Status: AC

## 2023-09-22 NOTE — Progress Notes (Signed)
 48 y.o. G73P0010 female status post hysterectomy with chronic BV, HSV who presents for follow-up visit. Single.  Patient's last menstrual period was 09/03/2020 (exact date).   Treated with MetroGel  twice a week for 3 months at the beginning of this year  Test of cure was negative 05/04/23. Went to [place] and was sexually active with intermittent partner.  Symptoms returned and she was treated with clindamycin  on 06/06/2023.  Mycoplasma Ureaplasma testing negative. She is still having some irritation today.  She completed her clindamycin  1 week ago. +BV at that time Treated for infection, did not complete suppression dosing due to yeast infection She is asymptomatic today Using probiotics and kombucha Washes with dove, no lotions Unscented laundry detergent Has not been SA since last appt  OB History  Gravida Para Term Preterm AB Living  1    1 0  SAB IAB Ectopic Multiple Live Births   1       # Outcome Date GA Lbr Len/2nd Weight Sex Type Anes PTL Lv  1 IAB             Past Medical History:  Diagnosis Date   Anemia due to blood loss, chronic    due to menorrhagia   Eczema    History of 2019 novel coronavirus disease (COVID-19) 02/01/2020   pt tested coivd positive , result in epic, which was done for surgery.  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   s/p cryo   History of herpes genitalis    History of kidney stones    last stone with surgery done 2007   History of sexually transmitted disease 2006   Hypertension    Menorrhagia    Uterine fibroid     Past Surgical History:  Procedure Laterality Date   CYSTOSCOPY WITH RETROGRADE PYELOGRAM, URETEROSCOPY AND STENT PLACEMENT  03-22-2005  @[facility]    DILATATION AND CURETTAGE/HYSTEROSCOPY WITH MINERVA  02/15/2020   @[facility]    EXTRACORPOREAL SHOCK WAVE LITHOTRIPSY  03/2005   GYNECOLOGIC CRYOSURGERY  2005   for fibroids   HYSTEROSCOPY WITH  RESECTOSCOPE  11-04-2008  @[facility]    MYOMECTOMY   MANDIBLE SURGERY Bilateral    REMOVAL CYST'S BILATERAL MANDIBLE AND FOUR TEETH EXTRACTION'S 01-30-2002  @[facility]    ROBOTIC ASSISTED TOTAL HYSTERECTOMY N/A 09/09/2020   Procedure: XI ROBOTIC ASSISTED TOTAL HYSTERECTOMY WITH BILATERAL SALPINGECTOMY;  Surgeon: Lavoie, Marie-Lyne, MD;  Location: [facility];  Service: Gynecology;  Laterality: N/A;   UMBILICAL HERNIA REPAIR     10-01-1999  @[facility] , 2 hernia repair umbilical as child    Current Outpatient Medications on File Prior to Visit  Medication Sig Dispense Refill   amLODipine  (NORVASC ) 10 MG tablet 1 tablet Orally Once a day for 30 day(s)     metoprolol  succinate (TOPROL -XL) 25 MG 24 hr tablet TAKE 1 TABLET BY MOUTH EVERY DAY FOR 30 DAYS for 90     valACYclovir  (VALTREX ) 500 MG tablet PROPHYLAXIS: TAKE 1 TABLET DAILY, IF HAS AN OUTBREAK, TAKE 1 TABLET TWICE A DAY  FOR 5 DAYS 90 tablet 3   No current facility-administered medications on file prior to visit.    No Known Allergies    PE Today's Vitals   09/22/23 0850  BP: (!) 162/104  Pulse: 90  Temp: 98.2 F (36.8 C)  TempSrc: Oral  SpO2: 99%  Weight: 177 lb (80.3 kg)    Body mass index is  30.86 kg/m.  Physical Exam Vitals reviewed. Exam conducted with a chaperone present.  Constitutional:      General: She is not in acute distress.    Appearance: Normal appearance.  HENT:     Head: Normocephalic and atraumatic.     Nose: Nose normal.  Eyes:     Extraocular Movements: Extraocular movements intact.     Conjunctiva/sclera: Conjunctivae normal.  Pulmonary:     Effort: Pulmonary effort is normal.  Genitourinary:    General: Normal vulva.     Exam position: Lithotomy position.     Vagina: Vaginal discharge present.     Uterus: Absent.      Adnexa: Right adnexa normal and left adnexa normal.     Comments: Uterus and cervix absent Musculoskeletal:        General: Normal range of motion.     Cervical back: Normal  range of motion.  Neurological:     General: No focal deficit present.     Mental Status: She is alert.  Psychiatric:        Mood and Affect: Mood normal.        Behavior: Behavior normal.      Assessment and Plan:        Bacterial vaginosis -     WET PREP FOR TRICH, YEAST, CLUE -     Boric Acid Vaginal; Place 1 suppository vaginally at bedtime.  Dispense: 30 suppository; Refill: 0  Perimenopause -     Estradiol  -     Follicle stimulating hormone -     Testos,Total,Free and SHBG (Female)  Other orders -     metroNIDAZOLE ; Take 1 tablet (500 mg total) by mouth 2 (two) times daily for 7 days.  Dispense: 14 tablet; Refill: 0  She has tried flagyl  and clindamycin  Did well with initial metrogel  supp but unable to complete recently due to yeast infection Switch to boric acid for suppression F/u is symptoms return  Will check hormones. May benefit from PV estadiol  Vera LULLA Pa, MD

## 2023-09-27 ENCOUNTER — Ambulatory Visit: Payer: Self-pay

## 2023-09-27 LAB — FOLLICLE STIMULATING HORMONE: FSH: 11.7 m[IU]/mL

## 2023-09-27 LAB — TESTOS,TOTAL,FREE AND SHBG (FEMALE)
Free Testosterone: 1.2 pg/mL (ref 0.1–6.4)
Sex Hormone Binding: 85 nmol/L (ref 17–124)
Testosterone, Total, LC-MS-MS: 16 ng/dL (ref 2–45)

## 2023-09-27 LAB — ESTRADIOL: Estradiol: 45 pg/mL

## 2023-09-27 NOTE — Telephone Encounter (Signed)
 No contact X 3 attempts.  Reason for Disposition . Third attempt to contact caller AND no contact made. Phone number verified. . Message left on unidentified voice mail. Phone number verified.  Protocols used: No Contact or Duplicate Contact Call-A-AH

## 2023-09-27 NOTE — Telephone Encounter (Signed)
 Call dropped during triage, placed into callbacks  Copied from CRM #[id number]. Topic: Clinical - Red Word Triage >> Sep 27, 2023  1:57 PM Alyssa [name] wrote: Red Word that prompted transfer to Nurse Triage: Patient went to dentist and the dentist stated her blood pressure is too hight and wont see her, and she needs to see someone now. She said she doesn't have a way to take her blood pressure herself,but feels off

## 2023-09-29 ENCOUNTER — Encounter: Payer: Self-pay | Admitting: Obstetrics and Gynecology

## 2023-10-12 ENCOUNTER — Other Ambulatory Visit: Payer: Self-pay | Admitting: Internal Medicine

## 2023-10-12 DIAGNOSIS — I10 Essential (primary) hypertension: Secondary | ICD-10-CM

## 2023-10-19 ENCOUNTER — Ambulatory Visit
Admission: RE | Admit: 2023-10-19 | Discharge: 2023-10-19 | Disposition: A | Discharge status: Home / Self Care | Source: Ambulatory Visit | Attending: Internal Medicine | Admitting: Internal Medicine

## 2023-10-19 DIAGNOSIS — I10 Essential (primary) hypertension: Secondary | ICD-10-CM

## 2023-11-01 ENCOUNTER — Telehealth: Payer: Self-pay | Admitting: *Deleted

## 2023-11-01 MED ORDER — FLUCONAZOLE 150 MG PO TABS: 150.0000 mg | ORAL_TABLET | Freq: Once | ORAL | 0 refills | Status: AC

## 2023-11-01 NOTE — Telephone Encounter (Signed)
 Spoke with patient, advised per Dr. Dallie. Patient is currently in [place]. Patient request Rx to [facility] on file for now, will keep OV as scheduled. Patient will return call to cancel if symptoms improved.   Encounter closed.

## 2023-11-01 NOTE — Telephone Encounter (Signed)
 Spoke with patient. Patient reports vaginal burning and possible discharge. States she is uncertain if she is seeing vaginal discharge or sweat. Denies itching, odor, pain, urinary symptoms. Currently on vacation out of town, very hot where she is, has not been in water  other than shower, has been sweaty. Has not been sexually active since last OV 09/22/23  Returning 11/07/23.   Competed boric acid supp previously recommended, symptoms had resolved. States she feels like this is yeast.   OV recommended, OV scheduled for 11/08/23 at 0915 with Dr. Dallie.   Advised I will send to Dr. Dallie to review, our office will f/u if any additional recommendations. Patient agreeable.   Routing to Dr. Dallie to review.

## 2023-11-08 ENCOUNTER — Ambulatory Visit: Admitting: Obstetrics and Gynecology

## 2023-11-08 ENCOUNTER — Encounter: Payer: Self-pay | Admitting: Obstetrics and Gynecology

## 2023-11-08 VITALS — BP 154/100 | HR 83 | Temp 98.5°F | Wt 179.0 lb

## 2023-11-08 DIAGNOSIS — N760 Acute vaginitis: Secondary | ICD-10-CM | POA: Diagnosis not present

## 2023-11-08 DIAGNOSIS — B9689 Other specified bacterial agents as the cause of diseases classified elsewhere: Secondary | ICD-10-CM

## 2023-11-08 LAB — WET PREP FOR TRICH, YEAST, CLUE

## 2023-11-08 MED ORDER — ESTRADIOL 0.01 % VA CREA
TOPICAL_CREAM | VAGINAL | 1 refills | Status: AC
Start: 2023-11-08 — End: ?

## 2023-11-08 MED ORDER — METRONIDAZOLE 0.75 % VA GEL: 1.0000 | Freq: Every day | VAGINAL | 0 refills | Status: AC

## 2023-11-08 NOTE — Progress Notes (Signed)
 48 y.o. G43P0010 female status post hysterectomy with chronic BV, HSV who presents for follow-up visit. Single.  Patient's last menstrual period was 09/03/2020 (exact date).   Treated with MetroGel  twice a week for 3 months at the beginning of this year  Test of cure was negative 05/04/23. Went to [place] and was sexually active with intermittent partner.  Symptoms returned and she was treated with clindamycin  on 06/06/2023.  Mycoplasma Ureaplasma testing negative. She is still having some irritation today.  She completed her clindamycin  1 week ago. +BV at that time Treated for infection, did not complete suppression dosing due to yeast infection She is asymptomatic today Using probiotics and kombucha Washes with dove, no lotions Unscented laundry detergent Has not been SA since last appt  Burning 1wk ago, lasted 2 days, no symptoms today. Sx started while on vacation in [place]- notes increased heat and sweating. Did not go swimming or wear bathing suit. Took diflucan  last night. Completed boric acid 2wk ago. Did well.  OB History  Gravida Para Term Preterm AB Living  1    1 0  SAB IAB Ectopic Multiple Live Births   1       # Outcome Date GA Lbr Len/2nd Weight Sex Type Anes PTL Lv  1 IAB             Past Medical History:  Diagnosis Date   Anemia due to blood loss, chronic    due to menorrhagia   Eczema    History of 2019 novel coronavirus disease (COVID-19) 02/01/2020   pt tested coivd positive , result in epic, which was done for surgery.  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   s/p cryo   History of herpes genitalis    History of kidney stones    last stone with surgery done 2007   History of sexually transmitted disease 2006   Hypertension    Menorrhagia    Uterine fibroid     Past Surgical History:  Procedure Laterality Date   CYSTOSCOPY WITH RETROGRADE PYELOGRAM, URETEROSCOPY  AND STENT PLACEMENT  03-22-2005  @[facility]    DILATATION AND CURETTAGE/HYSTEROSCOPY WITH MINERVA  02/15/2020   @[facility]    EXTRACORPOREAL SHOCK WAVE LITHOTRIPSY  03/2005   GYNECOLOGIC CRYOSURGERY  2005   for fibroids   HYSTEROSCOPY WITH RESECTOSCOPE  11-04-2008  @[facility]    MYOMECTOMY   MANDIBLE SURGERY Bilateral    REMOVAL CYST'S BILATERAL MANDIBLE AND FOUR TEETH EXTRACTION'S 01-30-2002  @[facility]    ROBOTIC ASSISTED TOTAL HYSTERECTOMY N/A 09/09/2020   Procedure: XI ROBOTIC ASSISTED TOTAL HYSTERECTOMY WITH BILATERAL SALPINGECTOMY;  Surgeon: Lavoie, Marie-Lyne, MD;  Location: [facility] ;  Service: Gynecology;  Laterality: N/A;   UMBILICAL HERNIA REPAIR     10-01-1999  @[facility] , 2 hernia repair umbilical as child    Current Outpatient Medications on File Prior to Visit  Medication Sig Dispense Refill   olmesartan (BENICAR) 20 MG tablet Take 20 mg by mouth daily.     valACYclovir  (VALTREX ) 500 MG tablet PROPHYLAXIS: TAKE 1 TABLET DAILY, IF HAS AN OUTBREAK, TAKE 1 TABLET TWICE A DAY  FOR 5 DAYS 90 tablet 3   amLODipine  (NORVASC ) 10 MG tablet 1 tablet Orally Once a day for 30 day(s) (Patient not taking: Reported on 11/08/2023)     metoprolol  succinate (TOPROL -XL) 25 MG 24 hr tablet TAKE 1 TABLET BY MOUTH EVERY DAY FOR 30 DAYS for  90 (Patient not taking: Reported on 11/08/2023)     No current facility-administered medications on file prior to visit.    No Known Allergies    PE Today's Vitals   11/08/23 0909 11/08/23 0915 11/08/23 0916  BP: (!) 160/120 (!) 162/110 (!) 154/100  Pulse: 83    Temp: 98.5 F (36.9 C)    TempSrc: Oral    SpO2: 99%    Weight: 179 lb (81.2 kg)      Body mass index is 31.21 kg/m.  Physical Exam Vitals reviewed. Exam conducted with a chaperone present.  Constitutional:      General: She is not in acute distress.    Appearance: Normal appearance.  HENT:     Head: Normocephalic and atraumatic.     Nose: Nose normal.  Eyes:     Extraocular Movements:  Extraocular movements intact.     Conjunctiva/sclera: Conjunctivae normal.  Pulmonary:     Effort: Pulmonary effort is normal.  Genitourinary:    General: Normal vulva.     Exam position: Lithotomy position.     Vagina: Vaginal discharge present.     Uterus: Absent.      Adnexa: Right adnexa normal and left adnexa normal.     Comments: Uterus and cervix absent Musculoskeletal:        General: Normal range of motion.     Cervical back: Normal range of motion.  Neurological:     General: No focal deficit present.     Mental Status: She is alert.  Psychiatric:        Mood and Affect: Mood normal.        Behavior: Behavior normal.      Assessment and Plan:        Bacterial vaginosis -     metroNIDAZOLE ; Place 1 Applicatorful vaginally at bedtime for 5 days.  Dispense: 50 g; Refill: 0  Recurrent vaginitis -     Estradiol ; Apply 0.5g to vulva nightly for 2 weeks then 2 times a week. Do not use applicator.  Dispense: 42.5 g; Refill: 1  She has tried flagyl  and clindamycin  Did well with initial metrogel  supp and boric acid for suppression Will treat with metrogel  and vaginal estrace   F/u at annual in December  Vera LULLA Pa, MD

## 2023-11-08 NOTE — Addendum Note (Signed)
 Addended by: DALLIE BOLLARD V on: 11/08/2023 10:16 AM   Modules accepted: Orders

## 2023-11-08 NOTE — Patient Instructions (Signed)
 Vaginal hygiene reviewed, including avoiding scented soaps, lotions, and menstrual products, perfumes, and douching and applying any products directly into the vagina. Use cotton, well fitted underwear during the day. Avoid wearing underwear to bed. Change clothing and shower immediately after intercourse, working out, or swimming in pools, ocean or hot tubs.

## 2023-11-09 ENCOUNTER — Ambulatory Visit: Payer: Self-pay | Admitting: Obstetrics and Gynecology

## 2023-11-28 ENCOUNTER — Ambulatory Visit: Admitting: Obstetrics and Gynecology

## 2023-12-27 ENCOUNTER — Encounter: Payer: Self-pay | Admitting: Nurse Practitioner

## 2023-12-27 ENCOUNTER — Ambulatory Visit: Admitting: Nurse Practitioner

## 2023-12-27 VITALS — BP 200/100 | HR 77 | Temp 98.0°F | Ht 62.0 in | Wt 177.4 lb

## 2023-12-27 DIAGNOSIS — Z90710 Acquired absence of both cervix and uterus: Secondary | ICD-10-CM | POA: Insufficient documentation

## 2023-12-27 DIAGNOSIS — L309 Dermatitis, unspecified: Secondary | ICD-10-CM

## 2023-12-27 DIAGNOSIS — I10 Essential (primary) hypertension: Secondary | ICD-10-CM | POA: Diagnosis not present

## 2023-12-27 MED ORDER — AMLODIPINE BESYLATE 5 MG PO TABS: 5.0000 mg | ORAL_TABLET | Freq: Every evening | ORAL | 1 refills | Status: DC

## 2023-12-27 MED ORDER — OLMESARTAN MEDOXOMIL 40 MG PO TABS: 40.0000 mg | ORAL_TABLET | Freq: Every morning | ORAL | 1 refills | Status: AC

## 2023-12-27 MED ORDER — CLOTRIMAZOLE-BETAMETHASONE 1-0.05 % EX CREA: 1.0000 | TOPICAL_CREAM | Freq: Two times a day (BID) | CUTANEOUS | 0 refills | Status: DC

## 2023-12-27 MED ORDER — MUPIROCIN 2 % EX OINT: 1.0000 | TOPICAL_OINTMENT | Freq: Two times a day (BID) | CUTANEOUS | 0 refills | Status: DC

## 2023-12-27 NOTE — Assessment & Plan Note (Addendum)
 1st diagnosed in 2022, admits to med non compliance. Last appointment with previous pcp ([facility]) in 10/2023. Labs completed: CBC, CMP, Tsh, and lipid panel. I reviewed lab results via portal on patient's cell phone. Normal lab results. Renal duplex completed 10/01//2025: no renal stenosis States she took amlodipine  10mg  and olmesartan  20mg  consistently x 2months, but no improvement in BP.  she discontinued 3weeks ago. She reports LE edema with amlodipine  10mg  in AM. Reports she has maintained low sodium diet, but no exercise due to work schedule. No snoring. No apneic episodes. No AM headaches, No caffeine. No tobacco use, no alcohol, no illicit drug use. BP Readings from Last 3 Encounters:  12/27/23 (!) 200/100  11/08/23 (!) 154/100  09/22/23 (!) 162/104    Advised about the possible adverse effects of uncontrolled HYPERTENSION including death. Advised to Start amlodipine  5mg  in PM and olmesartan  40mg  in AM. Use compression stocking during the day and off at night Call office if BP remains >160/90 after taking meds as prescribed x2weeks Check BP at home in AM, no less than 3x/week. Bring BP readings and machine to next appointment. Maintain a low salt/sodium diet, and daily exercise  Avoid salt substitute seasonings  Requested copy of lab results. F/up in 4-6weeks. Sooner if needed

## 2023-12-27 NOTE — Progress Notes (Signed)
 Established Patient Visit  Patient: Alyssa [name]   DOB: Dec 03, 1975   47 y.o. Female  MRN: [id number] Visit Date: 12/27/2023  Subjective:    Chief Complaint  Patient presents with   Hypertension    Questions and concerns about Blood pressure and other issues    HTN (hypertension), benign 1st diagnosed in 2022, admits to med non compliance. Last appointment with previous pcp ([facility]) in 10/2023. Labs completed: CBC, CMP, Tsh, and lipid panel. I reviewed lab results via portal on patient's cell phone. Normal lab results. Renal duplex completed 10/01//2025: no renal stenosis States she took amlodipine  10mg  and olmesartan  20mg  consistently x 2months, but no improvement in BP.  she discontinued 3weeks ago. She reports LE edema with amlodipine  10mg  in AM. Reports she has maintained low sodium diet, but no exercise due to work schedule. No snoring. No apneic episodes. No AM headaches, No caffeine. No tobacco use, no alcohol, no illicit drug use. BP Readings from Last 3 Encounters:  12/27/23 (!) 200/100  11/08/23 (!) 154/100  09/22/23 (!) 162/104    Advised about the possible adverse effects of uncontrolled HYPERTENSION including death. Advised to Start amlodipine  5mg  in PM and olmesartan  40mg  in AM. Use compression stocking during the day and off at night Call office if BP remains >160/90 after taking meds as prescribed x2weeks Check BP at home in AM, no less than 3x/week. Bring BP readings and machine to next appointment. Maintain a low salt/sodium diet, and daily exercise  Avoid salt substitute seasonings  Requested copy of lab results. F/up in 4-6weeks. 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 Treat in the past with augmentin  x 7days, keflex  x7days, and diflucan  x 3tabs. Infection has resolved at the time. She states she noticed drainage, tenderness and smell shortly after that.  Serous drainage  noted inside umbilicus, surrounding scaly skin  and mild erythema. Advised to Clean umbilical region (inside and outside) with hibiclen soap once a day. Apply antibacterial ointment in umbilicus and lotrisone  cream on outside region 2x/day. Maintain upcoming appointment with dermatology  Reviewed medical, surgical, and social history today  Medications: Outpatient Medications Prior to Visit  Medication Sig Note   estradiol  (ESTRACE ) 0.01 % CREA vaginal cream Apply 0.5g to vulva nightly for 2 weeks then 2 times a week. Do not use applicator. (Patient taking differently: Place 1 Applicatorful vaginally as needed. Apply 0.5g to vulva nightly for 2 weeks then 2 times a week. Do not use applicator.)    Probiotic Product (PROBIOTIC PO) Take 1 capsule by mouth daily.    valACYclovir  (VALTREX ) 500 MG tablet PROPHYLAXIS: TAKE 1 TABLET DAILY, IF HAS AN OUTBREAK, TAKE 1 TABLET TWICE A DAY  FOR 5 DAYS    [DISCONTINUED] olmesartan  (BENICAR ) 20 MG tablet Take 20 mg by mouth daily. (Patient taking differently: Take 20 mg by mouth as needed.)    [DISCONTINUED] amLODipine  (NORVASC ) 10 MG tablet 1 tablet Orally Once a day for 30 day(s) (Patient not taking: Reported on 12/27/2023) 12/27/2023: Has not taken in 2 months    [DISCONTINUED] metoprolol  succinate (TOPROL -XL) 25 MG 24 hr tablet TAKE 1 TABLET BY MOUTH EVERY DAY FOR 30 DAYS for 90 (Patient not taking: Reported on 12/27/2023)    No facility-administered medications prior to visit.   Reviewed past medical and social history.   ROS per HPI above      Objective:  BP (!) 200/100 (BP Location: Left Arm, Patient Position: Supine, Cuff Size: Normal)   Pulse 77   Temp 98 F (36.7 C) (Oral)   Ht 5' 2 (1.575 m)   Wt 177 lb 6.4 oz (80.5 kg)   LMP 09/03/2020 (Exact Date) Comment: Urine pregnancy test on 09/09/2020 negative  SpO2 100%   BMI 32.45 kg/m      Physical Exam Vitals and nursing note reviewed.  Constitutional:      Appearance: She is obese.   Cardiovascular:     Rate and Rhythm: Normal rate and regular rhythm.     Pulses: Normal pulses.     Heart sounds: Normal heart sounds.  Pulmonary:     Effort: Pulmonary effort is normal.     Breath sounds: Normal breath sounds.  Musculoskeletal:     Right lower leg: No edema.     Left lower leg: No edema.  Skin:    Findings: Rash present. Rash is crusting. Rash is not nodular.      Neurological:     Mental Status: She is alert and oriented to person, place, and time.     No results found for any visits on 12/27/23.    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 Treat in the past with augmentin  x 7days, keflex  x7days, and diflucan  x 3tabs. Infection has resolved at the time. She states she noticed drainage, tenderness and smell shortly after that.  Serous drainage noted inside umbilicus, surrounding scaly skin  and mild erythema. Advised to Clean umbilical region (inside and outside) with hibiclen soap once a day. Apply antibacterial ointment in umbilicus and lotrisone  cream on outside region 2x/day. Maintain upcoming appointment with dermatology      Relevant Medications   mupirocin  ointment (BACTROBAN ) 2 %   clotrimazole -betamethasone  (LOTRISONE ) cream   HTN (hypertension), benign - Primary   1st diagnosed in 2022, admits to med non compliance. Last appointment with previous pcp ([facility]) in 10/2023. Labs completed: CBC, CMP, Tsh, and lipid panel. I reviewed lab results via portal on patient's cell phone. Normal lab results. Renal duplex completed 10/01//2025: no renal stenosis States she took amlodipine  10mg  and olmesartan  20mg  consistently x 2months, but no improvement in BP.  she discontinued 3weeks ago. She reports LE edema with amlodipine  10mg  in AM. Reports she has maintained low sodium diet, but no exercise due to work schedule. No snoring. No apneic  episodes. No AM headaches, No caffeine. No tobacco use, no alcohol, no illicit drug use. BP Readings from Last 3 Encounters:  12/27/23 (!) 200/100  11/08/23 (!) 154/100  09/22/23 (!) 162/104    Advised about the possible adverse effects of uncontrolled HYPERTENSION including death. Advised to Start amlodipine  5mg  in PM and olmesartan  40mg  in AM. Use compression stocking during the day and off at night Call office if BP remains >160/90 after taking meds as prescribed x2weeks Check BP at home in AM, no less than 3x/week. Bring BP readings and machine to next appointment. Maintain a low salt/sodium diet, and daily exercise  Avoid salt substitute seasonings  Requested copy of lab results. F/up in 4-6weeks. Sooner if needed      Relevant Medications   amLODipine  (NORVASC ) 5 MG tablet   olmesartan  (BENICAR ) 40 MG tablet   Other Relevant Orders   EKG 12-Lead   Return in about 6 weeks (around 02/07/2024) for HTN and  dermatitis.     Roselie Mood, NP

## 2023-12-27 NOTE — Patient Instructions (Addendum)
 Thank you for choosing  primary care  Sign medical release form to get your records from previous providers.  Check BP at home in AM, no less than 3x/week. Bring BP readings and machine to next appointment. Maintain a low salt/sodium diet, and daily exercise  Avoid salt substitute seasonings  Start amlodipine  5mg  in PM and olmesartan  40mg  in AM Call office if BP remains >160/90 after taking meds as prescribed x2weeks  Clean umbilical region with hibiclen soap once a day. Apply antibacterial ointment in umbilicus and cortisone cream on outside region 2x/day.

## 2023-12-27 NOTE — Assessment & Plan Note (Addendum)
 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 Treat in the past with augmentin  x 7days, keflex  x7days, and diflucan  x 3tabs. Infection has resolved at the time. She states she noticed drainage, tenderness and smell shortly after that.  Serous drainage noted inside umbilicus, surrounding scaly skin  and mild erythema. Advised to Clean umbilical region (inside and outside) with hibiclen soap once a day. Apply antibacterial ointment in umbilicus and lotrisone  cream on outside region 2x/day. Maintain upcoming appointment with dermatology

## 2024-01-02 ENCOUNTER — Encounter: Payer: Self-pay | Admitting: Obstetrics and Gynecology

## 2024-01-02 ENCOUNTER — Ambulatory Visit: Admitting: Obstetrics and Gynecology

## 2024-01-02 VITALS — BP 142/102 | HR 84 | Temp 98.7°F | Ht 62.75 in | Wt 175.0 lb

## 2024-01-02 DIAGNOSIS — Z90710 Acquired absence of both cervix and uterus: Secondary | ICD-10-CM | POA: Diagnosis not present

## 2024-01-02 DIAGNOSIS — Z1331 Encounter for screening for depression: Secondary | ICD-10-CM

## 2024-01-02 DIAGNOSIS — Z1211 Encounter for screening for malignant neoplasm of colon: Secondary | ICD-10-CM

## 2024-01-02 DIAGNOSIS — Z01419 Encounter for gynecological examination (general) (routine) without abnormal findings: Secondary | ICD-10-CM | POA: Insufficient documentation

## 2024-01-02 NOTE — Progress Notes (Signed)
 48 y.o. G47P0010 female s/p hysterectomy (benign-fibroids) with chronic BV, HSV here for annual exam. Single. PCP: Katheen Roselie [place], NP   Patient's last menstrual period was 09/03/2020.   She reports doing well. No vaginal odor since starting vaginal estrogen, however she is forgetting to use.  Last used 3 weeks ago. Started mupirocin  for umbilical infection last week, improved.  Abnormal bleeding: none Pelvic discharge or pain: none Breast mass, nipple discharge or skin changes : none  Sexually active: no  Last PAP:     Component Value Date/Time   DIAGPAP  12/29/2022 1027    - Negative for intraepithelial lesion or malignancy (NILM)   HPVHIGH Negative 12/29/2022 1027   ADEQPAP Satisfactory for evaluation. 12/29/2022 1027   Last mammogram: 08/24/23 Bi-Rads 1, Density B   Last colonoscopy: never   Exercising: No. Try to walk some  Smoker: no  [employer] Visit from 01/02/2024 in [facility]  PHQ-2 Total Score 0    Flowsheet Row Office Visit from 12/27/2023 in [facility]  PHQ-9 Total Score 0     GYN HISTORY: 2005, cervical dysplasia current treated with cryotherapy 2022, benign hysterectomy for fibroids  OB History  Gravida Para Term Preterm AB Living  1    1 0  SAB IAB Ectopic Multiple Live Births   1       # Outcome Date GA Lbr Len/2nd Weight Sex Type Anes PTL Lv  1 IAB            Past Medical History:  Diagnosis Date   Anemia due to blood loss, chronic    due to menorrhagia   CIN I (cervical intraepithelial neoplasia I) 02/19/2003   CRYO     Eczema    Fibroid    MYOMA     History of 2019 novel coronavirus disease (COVID-19) 02/01/2020   pt tested coivd positive , result in epic, which was done for surgery.  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   s/p cryo   History of herpes genitalis     History of kidney stones    last stone with surgery done 2007   History of sexually transmitted disease 2006   Hypertension    Menorrhagia    STD (sexually transmitted disease) 08/18/2004   POSITIVE GC     Uterine fibroid    Past Surgical History:  Procedure Laterality Date   CYSTOSCOPY WITH RETROGRADE PYELOGRAM, URETEROSCOPY AND STENT PLACEMENT  03-22-2005  @[facility]    DILATATION AND CURETTAGE/HYSTEROSCOPY WITH MINERVA  02/15/2020   @[facility]    EXTRACORPOREAL SHOCK WAVE LITHOTRIPSY  03/2005   GYNECOLOGIC CRYOSURGERY  2005   for fibroids   HYSTEROSCOPY WITH RESECTOSCOPE  11-04-2008  @[facility]    MYOMECTOMY   MANDIBLE SURGERY Bilateral    REMOVAL CYST'S BILATERAL MANDIBLE AND FOUR TEETH EXTRACTION'S 01-30-2002  @[facility]    ROBOTIC ASSISTED TOTAL HYSTERECTOMY N/A 09/09/2020   Procedure: XI ROBOTIC ASSISTED TOTAL HYSTERECTOMY WITH BILATERAL SALPINGECTOMY;  Surgeon: Lavoie, Marie-Lyne, MD;  Location: [facility];  Service: Gynecology;  Laterality: N/A;   UMBILICAL HERNIA REPAIR     10-01-1999  @[facility] , 2 hernia repair umbilical as child   Medications Ordered Prior to Encounter[1] Social History   Socioeconomic History   Marital status: Single    Spouse name: Not on file   Number of children: 0  Years of education: Not on file   Highest education level: Not on file  Occupational History   Not on file  Tobacco Use   Smoking status: Never    Passive exposure: Never   Smokeless tobacco: Never  Vaping Use   Vaping status: Never Used  Substance and Sexual Activity   Alcohol use: Yes    Alcohol/week: 2.0 standard drinks of alcohol    Types: 2 Glasses of wine per week    Comment: once a month   Drug use: Never   Sexual activity: Not Currently    Partners: Male    Birth control/protection: Surgical    Comment: 1st intercourse 48 yo-Fewer than 5 partners, hysterectomy  Other Topics Concern   Not on file  Social History Narrative   Not on file   Social Drivers of Health   Tobacco  Use: Low Risk (01/02/2024)   Patient History    Smoking Tobacco Use: Never    Smokeless Tobacco Use: Never    Passive Exposure: Never  Financial Resource Strain: Not on file  Food Insecurity: Not on file  Transportation Needs: Not on file  Physical Activity: Not on file  Stress: Not on file  Social Connections: Not on file  Intimate Partner Violence: Not on file  Depression (PHQ2-9): Low Risk (01/02/2024)   Depression (PHQ2-9)    PHQ-2 Score: 0  Alcohol Screen: Not on file  Housing: Not on file  Utilities: Not on file  Health Literacy: Not on file   Family History  Problem Relation Age of Onset   Breast cancer Mother        unknown age   Diabetes Mother    Hypertension Mother    Kidney failure Mother    Diabetes Maternal Grandmother    Hypertension Maternal Grandmother    Cancer Maternal Grandfather        Bone cancer   Allergies[2]   PE Today's Vitals   01/02/24 1130  BP: (!) 142/102  Pulse: 84  Temp: 98.7 F (37.1 C)  TempSrc: Oral  SpO2: 99%  Weight: 175 lb (79.4 kg)  Height: 5' 2.75 (1.594 m)   Body mass index is 31.25 kg/m.  Physical Exam Vitals reviewed. Exam conducted with a chaperone present.  Constitutional:      General: She is not in acute distress.    Appearance: Normal appearance.  HENT:     Head: Normocephalic and atraumatic.     Nose: Nose normal.  Eyes:     Extraocular Movements: Extraocular movements intact.     Conjunctiva/sclera: Conjunctivae normal.  Pulmonary:     Effort: Pulmonary effort is normal.  Chest:     Chest wall: No mass or tenderness.  Breasts:    Right: Normal. No swelling, mass, nipple discharge or tenderness.     Left: Normal. No swelling, mass, nipple discharge or tenderness.  Abdominal:     General: There is no distension.     Palpations: Abdomen is soft.     Tenderness: There is no abdominal tenderness.  Genitourinary:    General: Normal vulva.     Exam position: Lithotomy position.     Urethra: No  prolapse.     Vagina: Normal. No vaginal discharge or bleeding.     Cervix: No lesion.     Adnexa: Right adnexa normal and left adnexa normal.     Comments: Cervix and uterus absent Musculoskeletal:        General: Normal range of motion.     Cervical  back: Normal range of motion.  Lymphadenopathy:     Upper Body:     Right upper body: No axillary adenopathy.     Left upper body: No axillary adenopathy.     Lower Body: No right inguinal adenopathy. No left inguinal adenopathy.  Skin:    General: Skin is warm and dry.  Neurological:     General: No focal deficit present.     Mental Status: She is alert.  Psychiatric:        Mood and Affect: Mood normal.        Behavior: Behavior normal.      Assessment and Plan:        Well woman exam with routine gynecological exam S/P hysterectomy Assessment & Plan: Cervical cancer screening N/A: s/p benign hysterectomy Encouraged annual mammogram screening Colonoscopy due, referral placed DXA N/A  Labs and immunizations with her primary Encouraged safe sexual practices as indicated Encouraged healthy lifestyle practices with diet and exercise For patients under 50yo, I recommend 1000mg  calcium daily and 600IU of vitamin D daily.   Colon cancer screening -     Ambulatory referral to Gastroenterology  Negative depression screening  Vera LULLA Pa, MD     [1]  Current Outpatient Medications on File Prior to Visit  Medication Sig Dispense Refill   amLODipine  (NORVASC ) 5 MG tablet Take 1 tablet (5 mg total) by mouth every evening. 90 tablet 1   clotrimazole -betamethasone  (LOTRISONE ) cream Apply 1 Application topically 2 (two) times daily. 60 g 0   estradiol  (ESTRACE ) 0.01 % CREA vaginal cream Apply 0.5g to vulva nightly for 2 weeks then 2 times a week. Do not use applicator. 42.5 g 1   mupirocin  ointment (BACTROBAN ) 2 % Apply 1 Application topically 2 (two) times daily. Apply with dressing change 22 g 0   olmesartan  (BENICAR ) 40  MG tablet Take 1 tablet (40 mg total) by mouth in the morning. 90 tablet 1   Probiotic Product (PROBIOTIC PO) Take 1 capsule by mouth daily.     valACYclovir  (VALTREX ) 500 MG tablet PROPHYLAXIS: TAKE 1 TABLET DAILY, IF HAS AN OUTBREAK, TAKE 1 TABLET TWICE A DAY  FOR 5 DAYS 90 tablet 3   No current facility-administered medications on file prior to visit.  [2] No Known Allergies

## 2024-01-02 NOTE — Assessment & Plan Note (Signed)
 Cervical cancer screening N/A: s/p benign hysterectomy Encouraged annual mammogram screening Colonoscopy due, referral placed DXA N/A  Labs and immunizations with her primary Encouraged safe sexual practices as indicated Encouraged healthy lifestyle practices with diet and exercise For patients under 46yo, I recommend 1000mg  calcium daily and 600IU of vitamin D daily.

## 2024-01-02 NOTE — Patient Instructions (Signed)

## 2024-02-01 ENCOUNTER — Ambulatory Visit: Admitting: Dermatology

## 2024-02-01 ENCOUNTER — Encounter: Payer: Self-pay | Admitting: Dermatology

## 2024-02-01 ENCOUNTER — Encounter: Payer: Self-pay | Admitting: Nurse Practitioner

## 2024-02-01 VITALS — BP 146/98

## 2024-02-01 DIAGNOSIS — L409 Psoriasis, unspecified: Secondary | ICD-10-CM

## 2024-02-01 DIAGNOSIS — L408 Other psoriasis: Secondary | ICD-10-CM

## 2024-02-01 DIAGNOSIS — L209 Atopic dermatitis, unspecified: Secondary | ICD-10-CM | POA: Diagnosis not present

## 2024-02-01 DIAGNOSIS — I10 Essential (primary) hypertension: Secondary | ICD-10-CM

## 2024-02-01 MED ORDER — ZORYVE 0.3 % EX FOAM: 1.0000 | Freq: Every day | CUTANEOUS | 5 refills | Status: DC

## 2024-02-01 MED ORDER — CLOBETASOL PROPIONATE 0.05 % EX OINT: 1.0000 | TOPICAL_OINTMENT | Freq: Two times a day (BID) | CUTANEOUS | 11 refills | Status: AC

## 2024-02-01 MED ORDER — TACROLIMUS 0.1 % EX OINT: TOPICAL_OINTMENT | Freq: Every day | CUTANEOUS | 2 refills | Status: AC

## 2024-02-01 NOTE — Progress Notes (Signed)
" ° °  New Patient Visit   Subjective  Alyssa [name] is a 49 y.o. female who presents for a NEW PATIENT appointment to be examined for the concerns as listed below.   Atopic Dermatitis: Pt stated she received clotrimazole -betamethasone  that was Rx from PCP for eczema at the eyelids, naval and spots on the B/L LE. She would apply topical BID for 3 days then it irritation resolved. However, she is flaring again and would like to discuss other Tx options. Pt has tried and failed TMC & clotrimazole -betamethasone .   Scalp Psoriasis: Pt stated that she cleanses her scalp weekly with DHS Zinc shampoo that she has been using for over 63yrs as recommended by Dr. Ivin prior to his retirement. However, she does not think the shampoo is effective anymore. She is dry and flaky rating the itch 9/10. She noted that she applies castor oil to the scalp every other day. She previously was using olive oil but D/C. Pt is not flared today as she just washed her hair Sunday.  Pt has tried and failed DHS Zinc shampoo & DermaSmooth oil.    Are you nursing, pregnant or trying to conceive? No   Reports Hx of Bx - benign.  Denied family Hx of skin cancer.   The following portions of the chart were reviewed this encounter and updated as appropriate: medications, allergies, medical history  Review of Systems:  No other skin or systemic complaints except as noted in HPI or Assessment and Plan.  Objective  Well appearing patient in no apparent distress; mood and affect are within normal limits.   A focused examination was performed of the following areas: scalp    Relevant exam findings are noted in the Assessment and Plan.               Assessment & Plan   SCALP PSORIASIS Exam: Well-demarcated erythematous papules/plaques with silvery scale, guttate pink scaly papules. 9% BSA. IGA 2  flared  patient denies joint pain  Treatment Plan: - Continue washing with DHS Zinc shampoo every 2 weeks - D/C  oil applications on the scalp.  - Rx Zorvye 0.3% foam - apply to the scalp once daily until clear. Then 2x a week for maintenance. Sent to [facility].    ATOPIC DERMATITIS Exam: Scaly pink papules coalescing to plaques 12% BSA. IGA 2  flared  Treatment Plan: - Discussed possibilities of using Nemluvio or Dupixent in the future - Rx clobetasol  oint - apply BID for 2 weeks on, 2 weeks off - repeat PRN for flares. Pt educated on potential steroid overuse.  - Rx tacrolimus  - apply daily until clear for flares on face.  SCALP PSORIASIS   This Visit - Roflumilast  (ZORYVE ) 0.3 % FOAM - Apply 1 Application topically daily. ATOPIC DERMATITIS, UNSPECIFIED TYPE   This Visit - clobetasol  ointment (TEMOVATE ) 0.05 % - Apply 1 Application topically 2 (two) times daily. Use for 2 weeks on, 2 weeks off. Repeat PRN for flares.  Return in about 3 months (around 05/01/2024) for ATOPIC DERM/ECZEMA, PSORIASIS.   Documentation: I have reviewed the above documentation for accuracy and completeness, and I agree with the above.  I, Shirron Maranda, CMA II, am acting as scribe for:   Delon Lenis, DO     "

## 2024-02-01 NOTE — Patient Instructions (Addendum)
 VISIT SUMMARY:  During your visit, we discussed your persistent scalp psoriasis and eczema symptoms. We reviewed your previous treatments and made adjustments to better manage your conditions.  YOUR PLAN:  -SCALP PSORIASIS:  Scalp psoriasis is a chronic skin condition that causes red, itchy, and flaky patches on the scalp. We have prescribed Zoryve  foam to be applied once daily during flares for at least two weeks, then twice weekly for maintenance. Avoid washing your hair more than every two weeks and refrain from using oils daily on your scalp. If Zoryve  is not covered or is too expensive, we will consider prescribing clobetasol  liquid for spot treatment.  -ATOPIC DERMATITIS:  Atopic dermatitis, also known as eczema, is a condition that makes your skin red and itchy. We have prescribed clobetasol  ointment for body eczema, to be applied twice daily for up to two weeks during flares. Use Vanicream as a daily moisturizer to prevent flares.   For facial eczema, use tacrolimus  ointment daily until clear, and resume if flares recur. Use creams in winter and lotions in summer for optimal hydration.  INSTRUCTIONS:  Please follow up with us  if you experience any issues with the prescribed medications or if your symptoms do not improve. We will send your prescription to [facility] pharmacy for cost-effective processing and delivery.      Important Information  Due to recent changes in healthcare laws, you may see results of your pathology and/or laboratory studies on MyChart before the doctors have had a chance to review them. We understand that in some cases there may be results that are confusing or concerning to you. Please understand that not all results are received at the same time and often the doctors may need to interpret multiple results in order to provide you with the best plan of care or course of treatment. Therefore, we ask that you please give us  2 business days to thoroughly review all  your results before contacting the office for clarification. Should we see a critical lab result, you will be contacted sooner.   If You Need Anything After Your Visit  If you have any questions or concerns for your doctor, please call our main line at [phone] If no one answers, please leave a voicemail as directed and we will return your call as soon as possible. Messages left after 4 pm will be answered the following business day.   You may also send us  a message via MyChart. We typically respond to MyChart messages within 1-2 business days.  For prescription refills, please ask your pharmacy to contact our office. Our fax number is [phone].  If you have an urgent issue when the clinic is closed that cannot wait until the next business day, you can page your doctor at the number below.    Please note that while we do our best to be available for urgent issues outside of office hours, we are not available 24/7.   If you have an urgent issue and are unable to reach us , you may choose to seek medical care at your doctor's office, retail clinic, urgent care center, or emergency room.  If you have a medical emergency, please immediately call 911 or go to the emergency department. In the event of inclement weather, please call our main line at [phone] for an update on the status of any delays or closures.  Dermatology Medication Tips: Please keep the boxes that topical medications come in in order to help keep track of the instructions about where  and how to use these. Pharmacies typically print the medication instructions only on the boxes and not directly on the medication tubes.   If your medication is too expensive, please contact our office at [phone] or send us  a message through MyChart.   We are unable to tell what your co-pay for medications will be in advance as this is different depending on your insurance coverage. However, we may be able to find a substitute  medication at lower cost or fill out paperwork to get insurance to cover a needed medication.   If a prior authorization is required to get your medication covered by your insurance company, please allow us  1-2 business days to complete this process.  Drug prices often vary depending on where the prescription is filled and some pharmacies may offer cheaper prices.  The website www.goodrx.com contains coupons for medications through different pharmacies. The prices here do not account for what the cost may be with help from insurance (it may be cheaper with your insurance), but the website can give you the price if you did not use any insurance.  - You can print the associated coupon and take it with your prescription to the pharmacy.  - You may also stop by our office during regular business hours and pick up a GoodRx coupon card.  - If you need your prescription sent electronically to a different pharmacy, notify our office through [facility] or by phone at [phone]

## 2024-02-08 ENCOUNTER — Other Ambulatory Visit: Payer: Self-pay

## 2024-02-08 DIAGNOSIS — L409 Psoriasis, unspecified: Secondary | ICD-10-CM

## 2024-02-08 MED ORDER — FLUOCINOLONE ACETONIDE SCALP 0.01 % EX OIL: 1.0000 | TOPICAL_OIL | CUTANEOUS | 9 refills | Status: AC | PRN

## 2024-02-08 NOTE — Telephone Encounter (Signed)
 Yes, that's ok.

## 2024-02-16 ENCOUNTER — Ambulatory Visit: Admitting: Nurse Practitioner

## 2024-02-16 ENCOUNTER — Encounter: Payer: Self-pay | Admitting: Nurse Practitioner

## 2024-02-16 VITALS — BP 160/100 | HR 92 | Temp 98.2°F | Ht 62.0 in | Wt 178.8 lb

## 2024-02-16 DIAGNOSIS — I10 Essential (primary) hypertension: Secondary | ICD-10-CM | POA: Diagnosis not present

## 2024-02-16 DIAGNOSIS — L309 Dermatitis, unspecified: Secondary | ICD-10-CM | POA: Diagnosis not present

## 2024-02-16 MED ORDER — TRIAMTERENE-HCTZ 37.5-25 MG PO TABS: 1.0000 | ORAL_TABLET | Freq: Every day | ORAL | 5 refills | Status: DC

## 2024-02-16 NOTE — Patient Instructions (Signed)
 Increase amlodipine  dose to 10mg  in PM Add maxzide 1tab in AM Continue BP checks at home Bring reading to next appt

## 2024-02-16 NOTE — Progress Notes (Signed)
 "               Established Patient Visit  Patient: Alyssa [name]   DOB: 04-08-1975   49 y.o. Female  MRN: [id number] Visit Date: 02/16/2024  Subjective:    Chief Complaint  Patient presents with   Follow-up    6 week follow for HTN, and Dermatitis  DUE for Colonoscopy, Prevnar    HPI HTN (hypertension), benign Home machine today: 174/116. Average BP at home: 140s/90s BP Readings from Last 3 Encounters:  02/16/24 (!) 160/100  02/01/24 (!) 146/98  01/02/24 (!) 142/102   Denies any adverse effects with olmesartan  40mg  and amlodipine  5mg . She has maintained a low salt, low fat diet. Has no exercise regimen. STOPBANG screen below Height: 5' 2 (1.575 m)  Weight:178lbs BMI: Body mass index is 32.7 kg/m. Neck circumference: 13.5inch Do you snore loudly (louder than talking or loud enough to be heard through closed doors)?  No Do you often feel tired, fatigued, or sleepy during daytime?  No Has anyone observed you stop breathing during your sleep?  No Do you have or are you being treated for high blood pressure?  Yes BMI more than 35 kg/m2?  No Age over 50 years?  No Neck circumference greater than 40 cm?  No Female gender?  No Total Yes Answers: 1 Low risk for OBSTRUCTIVE SLEEP APNEA.  Increase amlodipine  dose to 10mg  in PM Add maxzide 37.5/25mg  in AM Maintain olmesartan  dose Consider referral for sleep study if no improvement F/up in 13month  Reviewed medical, surgical, and social history today  Medications: Show/hide medication list[1] Reviewed past medical and social history.   ROS per HPI above      Objective:  BP (!) 160/100 (BP Location: Left Arm, Patient Position: Sitting, Cuff Size: Normal)   Pulse 92   Temp 98.2 F (36.8 C) (Oral)   Ht 5' 2 (1.575 m)   Wt 178 lb 12.8 oz (81.1 kg)   LMP 09/03/2020   SpO2 99%   BMI 32.70 kg/m      Physical Exam Vitals and nursing note reviewed.  Cardiovascular:     Rate and Rhythm: Normal rate and  regular rhythm.     Chest Wall: PMI is not displaced. No thrill.     Pulses: Normal pulses.     Heart sounds: Normal heart sounds.     No gallop.  Pulmonary:     Effort: Pulmonary effort is normal.     Breath sounds: Normal breath sounds.  Musculoskeletal:     Right lower leg: No edema.     Left lower leg: No edema.  Neurological:     Mental Status: She is alert and oriented to person, place, and time.     No results found for any visits on 02/16/24.    Assessment & Plan:    Problem List Items Addressed This Visit     Dermatitis - Primary   HTN (hypertension), benign   Home machine today: 174/116. Average BP at home: 140s/90s BP Readings from Last 3 Encounters:  02/16/24 (!) 160/100  02/01/24 (!) 146/98  01/02/24 (!) 142/102   Denies any adverse effects with olmesartan  40mg  and amlodipine  5mg . She has maintained a low salt, low fat diet. Has no exercise regimen. STOPBANG screen below Height: 5' 2 (1.575 m)  Weight:178lbs BMI: Body mass index is 32.7 kg/m. Neck circumference: 13.5inch Do you snore loudly (louder than talking or loud enough to be heard through closed doors)?  No Do  you often feel tired, fatigued, or sleepy during daytime?  No Has anyone observed you stop breathing during your sleep?  No Do you have or are you being treated for high blood pressure?  Yes BMI more than 35 kg/m2?  No Age over 50 years?  No Neck circumference greater than 40 cm?  No Female gender?  No Total Yes Answers: 1 Low risk for OBSTRUCTIVE SLEEP APNEA.  Increase amlodipine  dose to 10mg  in PM Add maxzide 37.5/25mg  in AM Maintain olmesartan  dose Consider referral for sleep study if no improvement F/up in 37month      Relevant Medications   amLODipine  (NORVASC ) 10 MG tablet   triamterene -hydrochlorothiazide (MAXZIDE-25) 37.5-25 MG tablet   Return in about 4 weeks (around 03/15/2024) for HTN.     Roselie Mood, NP      [1]  Outpatient Medications Prior to Visit   Medication Sig   clobetasol  ointment (TEMOVATE ) 0.05 % Apply 1 Application topically 2 (two) times daily. Use for 2 weeks on, 2 weeks off. Repeat PRN for flares.   estradiol  (ESTRACE ) 0.01 % CREA vaginal cream Apply 0.5g to vulva nightly for 2 weeks then 2 times a week. Do not use applicator.   Fluocinolone  Acetonide Scalp (DERMA-SMOOTHE /FS SCALP) 0.01 % OIL Apply 1 Application topically as needed. Apply to the scalp 30 minutes or overnight prior to washing with shampoo of your choice   olmesartan  (BENICAR ) 40 MG tablet Take 1 tablet (40 mg total) by mouth in the morning.   Probiotic Product (PROBIOTIC PO) Take 1 capsule by mouth daily.   tacrolimus  (PROTOPIC ) 0.1 % ointment Apply topically daily. apply daily until clear for flares on face. (Patient taking differently: Apply topically as needed. apply daily until clear for flares on face.)   valACYclovir  (VALTREX ) 500 MG tablet PROPHYLAXIS: TAKE 1 TABLET DAILY, IF HAS AN OUTBREAK, TAKE 1 TABLET TWICE A DAY  FOR 5 DAYS   [DISCONTINUED] amLODipine  (NORVASC ) 5 MG tablet Take 1 tablet (5 mg total) by mouth every evening.   amLODipine  (NORVASC ) 10 MG tablet Take 1 tablet (10 mg total) by mouth every evening.   [DISCONTINUED] clotrimazole -betamethasone  (LOTRISONE ) cream Apply 1 Application topically 2 (two) times daily. (Patient not taking: Reported on 02/16/2024)   [DISCONTINUED] mupirocin  ointment (BACTROBAN ) 2 % Apply 1 Application topically 2 (two) times daily. Apply with dressing change (Patient not taking: Reported on 02/16/2024)   [DISCONTINUED] Roflumilast  (ZORYVE ) 0.3 % FOAM Apply 1 Application topically daily.   No facility-administered medications prior to visit.   "

## 2024-02-16 NOTE — Assessment & Plan Note (Signed)
 Home machine today: 174/116. Average BP at home: 140s/90s BP Readings from Last 3 Encounters:  02/16/24 (!) 160/100  02/01/24 (!) 146/98  01/02/24 (!) 142/102   Denies any adverse effects with olmesartan  40mg  and amlodipine  5mg . She has maintained a low salt, low fat diet. Has no exercise regimen. STOPBANG screen below Height: 5' 2 (1.575 m)  Weight:178lbs BMI: Body mass index is 32.7 kg/m. Neck circumference: 13.5inch Do you snore loudly (louder than talking or loud enough to be heard through closed doors)?  No Do you often feel tired, fatigued, or sleepy during daytime?  No Has anyone observed you stop breathing during your sleep?  No Do you have or are you being treated for high blood pressure?  Yes BMI more than 35 kg/m2?  No Age over 50 years?  No Neck circumference greater than 40 cm?  No Female gender?  No Total Yes Answers: 1 Low risk for OBSTRUCTIVE SLEEP APNEA.  Increase amlodipine  dose to 10mg  in PM Add maxzide 37.5/25mg  in AM Maintain olmesartan  dose Consider referral for sleep study if no improvement F/up in 5month

## 2024-02-21 ENCOUNTER — Encounter: Payer: Self-pay | Admitting: Pediatrics

## 2024-02-21 ENCOUNTER — Encounter: Payer: Self-pay | Admitting: Gastroenterology

## 2024-02-23 MED ORDER — HYDROCHLOROTHIAZIDE 12.5 MG PO CAPS: 12.5000 mg | ORAL_CAPSULE | Freq: Every day | ORAL | 5 refills | Status: AC

## 2024-02-23 NOTE — Assessment & Plan Note (Signed)
 BP has improved maxzide, but unable to tolerate the side effect (severe headache).  Stop maxzide. Start hydrochlorothiazide  12.5mg  Continue to monitor BP daily in AM Bring BP readings and home machine to your next appointment.

## 2024-02-23 NOTE — Addendum Note (Signed)
 Addended by: KATHEEN ROSELIE CROME on: 02/23/2024 03:16 PM   Modules accepted: Orders

## 2024-02-24 ENCOUNTER — Ambulatory Visit

## 2024-02-24 VITALS — Ht 62.0 in | Wt 178.0 lb

## 2024-02-24 DIAGNOSIS — Z1211 Encounter for screening for malignant neoplasm of colon: Secondary | ICD-10-CM

## 2024-02-24 MED ORDER — NA SULFATE-K SULFATE-MG SULF 17.5-3.13-1.6 GM/177ML PO SOLN: 1.0000 | Freq: Once | ORAL | 0 refills | Status: AC

## 2024-02-24 NOTE — Progress Notes (Signed)
 No egg or soy allergy known to patient  No issues known to pt with past sedation with any surgeries or procedures Patient denies ever being told they had issues or difficulty with intubation  No FH of Malignant Hyperthermia Pt is not on diet pills Pt is not on  home 02  Pt is not on blood thinners  Pt denies issues with constipation  No A fib or A flutter Have any cardiac testing pending--No Pt can ambulate  Pt denies use of chewing tobacco Discussed diabetic I weight loss medication holds Discussed NSAID holds Checked BMI Pt instructed to use Singlecare.com or GoodRx for a price reduction on prep  Patient's chart reviewed Pre visit completed schedule).

## 2024-03-12 ENCOUNTER — Encounter: Admitting: Pediatrics

## 2024-04-02 ENCOUNTER — Encounter: Admitting: Pediatrics

## 2024-04-24 ENCOUNTER — Ambulatory Visit: Admitting: Nurse Practitioner

## 2024-05-02 ENCOUNTER — Ambulatory Visit: Admitting: Dermatology

## 2024-06-04 ENCOUNTER — Ambulatory Visit: Admitting: Dermatology
